# Patient Record
Sex: Female | Born: 1960 | Race: White | Hispanic: No | Marital: Single | Smoking: Never smoker
Health system: Southern US, Community
[De-identification: ages and names within clinical notes are randomized; demographics above are authoritative.]

## PROBLEM LIST (undated history)

## (undated) DIAGNOSIS — D241 Benign neoplasm of right breast: Secondary | ICD-10-CM

## (undated) DIAGNOSIS — I10 Essential (primary) hypertension: Secondary | ICD-10-CM

## (undated) DIAGNOSIS — M7730 Calcaneal spur, unspecified foot: Secondary | ICD-10-CM

## (undated) DIAGNOSIS — N6452 Nipple discharge: Secondary | ICD-10-CM

## (undated) DIAGNOSIS — Q676 Pectus excavatum: Secondary | ICD-10-CM

## (undated) DIAGNOSIS — Z9882 Breast implant status: Secondary | ICD-10-CM

## (undated) HISTORY — DX: Calcaneal spur, unspecified foot: M77.30

## (undated) HISTORY — DX: Pectus excavatum: Q67.6

## (undated) HISTORY — DX: Breast implant status: Z98.82

---

## 1987-09-11 HISTORY — PX: PLACEMENT OF BREAST IMPLANTS: SHX6334

## 1987-09-11 HISTORY — PX: AUGMENTATION MAMMAPLASTY: SUR837

## 1993-09-10 HISTORY — PX: TONSILLECTOMY: SUR1361

## 1998-10-20 ENCOUNTER — Other Ambulatory Visit: Admission: RE | Admit: 1998-10-20 | Discharge: 1998-10-20 | Payer: Self-pay | Admitting: *Deleted

## 1999-11-07 ENCOUNTER — Other Ambulatory Visit: Admission: RE | Admit: 1999-11-07 | Discharge: 1999-11-07 | Payer: Self-pay | Admitting: *Deleted

## 2000-04-15 ENCOUNTER — Other Ambulatory Visit: Admission: RE | Admit: 2000-04-15 | Discharge: 2000-04-15 | Payer: Self-pay | Admitting: *Deleted

## 2000-05-28 ENCOUNTER — Encounter (INDEPENDENT_AMBULATORY_CARE_PROVIDER_SITE_OTHER): Payer: Self-pay | Admitting: Specialist

## 2000-05-28 ENCOUNTER — Other Ambulatory Visit: Admission: RE | Admit: 2000-05-28 | Discharge: 2000-05-28 | Payer: Self-pay | Admitting: *Deleted

## 2000-06-18 ENCOUNTER — Other Ambulatory Visit: Admission: RE | Admit: 2000-06-18 | Discharge: 2000-06-18 | Payer: Self-pay | Admitting: *Deleted

## 2000-06-18 ENCOUNTER — Encounter (INDEPENDENT_AMBULATORY_CARE_PROVIDER_SITE_OTHER): Payer: Self-pay | Admitting: Specialist

## 2001-01-06 ENCOUNTER — Other Ambulatory Visit: Admission: RE | Admit: 2001-01-06 | Discharge: 2001-01-06 | Payer: Self-pay | Admitting: *Deleted

## 2001-06-24 ENCOUNTER — Other Ambulatory Visit: Admission: RE | Admit: 2001-06-24 | Discharge: 2001-06-24 | Payer: Self-pay | Admitting: *Deleted

## 2002-08-27 ENCOUNTER — Other Ambulatory Visit: Admission: RE | Admit: 2002-08-27 | Discharge: 2002-08-27 | Payer: Self-pay | Admitting: *Deleted

## 2003-04-07 ENCOUNTER — Encounter: Payer: Self-pay | Admitting: Emergency Medicine

## 2003-04-08 ENCOUNTER — Encounter: Payer: Self-pay | Admitting: General Surgery

## 2003-04-08 ENCOUNTER — Encounter: Payer: Self-pay | Admitting: Emergency Medicine

## 2003-04-08 ENCOUNTER — Inpatient Hospital Stay (HOSPITAL_COMMUNITY): Admission: EM | Admit: 2003-04-08 | Discharge: 2003-04-09 | Payer: Self-pay | Admitting: Emergency Medicine

## 2003-09-22 ENCOUNTER — Other Ambulatory Visit: Admission: RE | Admit: 2003-09-22 | Discharge: 2003-09-22 | Payer: Self-pay | Admitting: *Deleted

## 2004-02-24 ENCOUNTER — Encounter: Admission: RE | Admit: 2004-02-24 | Discharge: 2004-02-24 | Payer: Self-pay | Admitting: Family Medicine

## 2005-03-12 ENCOUNTER — Other Ambulatory Visit: Admission: RE | Admit: 2005-03-12 | Discharge: 2005-03-12 | Payer: Self-pay | Admitting: Obstetrics and Gynecology

## 2007-06-02 ENCOUNTER — Encounter: Admission: RE | Admit: 2007-06-02 | Discharge: 2007-06-02 | Payer: Self-pay | Admitting: Obstetrics and Gynecology

## 2007-09-11 HISTORY — PX: BREAST SURGERY: SHX581

## 2007-09-11 HISTORY — PX: OTHER SURGICAL HISTORY: SHX169

## 2007-09-17 ENCOUNTER — Ambulatory Visit (HOSPITAL_BASED_OUTPATIENT_CLINIC_OR_DEPARTMENT_OTHER): Admission: RE | Admit: 2007-09-17 | Discharge: 2007-09-17 | Payer: Self-pay | Admitting: General Surgery

## 2007-09-17 ENCOUNTER — Encounter (INDEPENDENT_AMBULATORY_CARE_PROVIDER_SITE_OTHER): Payer: Self-pay | Admitting: General Surgery

## 2011-01-23 NOTE — Op Note (Signed)
Kim Chang, Kim Chang                  ACCOUNT NO.:  [id number]   MEDICAL RECORD NO.:  [id number]          PATIENT TYPE:  AMB   LOCATION:  DSC                          FACILITY:  [facility]   PHYSICIAN:  Angelia Mould. Derrell Lolling, M.D.DATE OF BIRTH:  1961/05/28   DATE OF PROCEDURE:  09/17/2007  DATE OF DISCHARGE:                               OPERATIVE REPORT   PREOPERATIVE DIAGNOSIS:  Bloody nipple discharge right breast, rule out  malignancy.   POSTOPERATIVE DIAGNOSIS:  Bloody nipple discharge right breast, rule out  malignancy.   OPERATION PERFORMED:  Right breast biopsy with excision of subareolar  breast tissue.   SURGEON:  Angelia Mould. Derrell Lolling, M.D.   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  She states that she has had some spontaneous right nipple  discharge for one year.  She reported this to Dr. Henderson Cloud this fall.  She had imaging studies and there was no mass or architectural  distortion.  There was a visibly dilated duct at the 7 o'clock position  of the right nipple which was cannulated for a ductogram.  Only a small  amount of contrast went into the duct, ended in a blind ending fashion  20 mm below ductal orifice.  The radiologist was worried about  malignancy.  On exam, she has a circumareolar incision at the inferior  areolar margin.  There was no real palpable mass.  I really could not  reproduce the nipple discharge, but there was a duct that was a little  bit larger than the other ones.  I really did not know whether she has a  papilloma, a malignancy or simply a scarring phenomenon from her  previous surgery.  She is brought to the operating room for excision of  the subareolar breast tissue for clarification of her diagnosis.   OPERATIVE TECHNIQUE:  Following the induction of general endotracheal  anesthesia, the patient's right breast was prepped and draped in a  sterile fashion.  I used 0.5% Marcaine epinephrine as a local  infiltration anesthetic.  I made a circumareolar incision inferiorly  through the old scar.  I tried to pass a lacrimal duct probe through the  duct at the 7 o'clock position but could not get this to go more than  about 1 cm.  I continued dissecting down into the breast tissue and  removed an area of subareolar duct tissue about 2.5 cm in all  dimensions.  She really did not have much breast tissue between the skin  and the pectoral muscle and one area actually went down to the  pectoralis fascia.  I took a separate small piece of tissue in the  immediate area below the nipple to be sure.  There was nothing that felt  like cancer.  The wound was irrigated with saline.  Hemostasis was  excellent and achieved with electrocautery.  The deeper breast tissues  were closed with interrupted sutures of 3-0 Vicryl and skin closed with  running subcuticular suture of  4-0 Monocryl and Dermabond.  Clean  bandages were placed and the patient taken to the recovery room in  stable condition.   ESTIMATED BLOOD LOSS:  About 10 mL or less.   COMPLICATIONS:  None.   Sponge, needle and instrument counts were correct.      Angelia Mould. Derrell Lolling, M.D.  Electronically Signed     HMI/MEDQ  D:  09/17/2007  T:  09/17/2007  Job:  [id number]   cc:   Guy Sandifer. Henderson Cloud, M.D.

## 2011-01-26 NOTE — H&P (Signed)
NAME:  Kim Chang, SAUVE                            ACCOUNT NO.:  [id number]   MEDICAL RECORD NO.:  [id number]                   PATIENT TYPE:  INP   LOCATION:  1823                                 FACILITY:  [facility]   PHYSICIAN:  Angelia Mould. Derrell Lolling, M.D.             DATE OF BIRTH:  01/02/1961   DATE OF ADMISSION:  04/08/2003  DATE OF DISCHARGE:                                HISTORY & PHYSICAL   CHIEF COMPLAINT:  Facial pain following fall.   HISTORY OF PRESENT ILLNESS:  This is a 50 year old white female who admits  to drinking several beers this evening while out with her friends.  On the  way home she became a little bit diaphoretic and while climbing the front  steps of her house apparently fell, striking her face in some way.  She has  poor memory through the event and thinks that she had a syncopal episode.  She states this happened about one month ago.  She initially went to the  emergency room at [facility] where she had imaging studies which showed a  basilar skull fracture.  She has remained alert and hemodynamically stable  complaining only of pain centrally around her nose.  She was transferred to  [facility] for neurosurgical consultation and trauma evaluation.   PAST MEDICAL HISTORY:  1. She has had two pregnancies and two deliveries.  2. She has had a tonsillectomy.  3. She denies any medical or surgical disease otherwise.   CURRENT MEDICATIONS:  Birth control pills.   ALLERGIES:  None known.   SOCIAL HISTORY:  The patient is single.  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  She  does smoke cigarettes and does drink alcohol.  Her boyfriend and daughter  are here with her tonight.  She states she had a tetanus shot in the [facility] Emergency Room.   FAMILY HISTORY:  Mother living, has coronary artery disease.  Father living,  has cancer of the prostate, lung, and head and neck.   REVIEW OF SYSTEMS:  All systems reviewed.  They are noncontributory except  as described  above.   PHYSICAL EXAMINATION:  GENERAL:  Alert, middle-aged white female in mild  distress from nasal pain.  VITAL SIGNS:  Pulse 78, blood pressure 106/64, respirations 20, temperature  97.8, oxygen saturation 100% on room air.  SKIN:  Warm and dry.  HEENT:  Normocephalic, atraumatic.  There is a small laceration of the upper  lip that was repaired at the [facility] ER.  This is not bleeding.  There  is minimal swelling there.  The nose is a little bit tender, but minimal  swelling there.  There is some ecchymoses in the right infraorbital area and  she is tender over the anterior wall of the right maxillary sinus.  Eyes  show pupils to be 3 mm, equally round and reactive to light.  Extraocular  movements are intact.  No injury to the eyes noted.  Ears:  No injury to the  ear helix.  Tympanic membranes clear bilaterally.  Jaw and mouth:  There is,  as mentioned above, a laceration of the upper lip that has been repaired.  Occlusion is normal.  NECK:  Supple, nontender.  There is no pain with active and passive range of  motion.  There is no posterior tenderness.  There is no swelling or crepitus  or distended veins.  Trachea is midline.  CHEST:  Chest wall was nontender.  Lungs are clear.  No trauma there.  BREASTS:  Not examined.  HEART:  Regular rate and rhythm.  No murmur.  ABDOMEN:  Soft, nontender.  Active bowel sounds.  Not distended.  Liver and  spleen not enlarged.  No hernias noted.  BACK:  Thoracic and lumbar spine nontender.  No signs of any trauma.  GENITOURINARY:  No blood noted around the rectum or urethral meatus.  PELVIC:  Stable to compression, nontender.  EXTREMITIES:  She moves all four extremities well.  There is no pain or  deformity of the extremities.  NEUROLOGIC:  She is alert and oriented x4.  She has poor memory for the  event.  She has normal motor function and sensory function all four  extremities.  VASCULAR:  Carotid, radial, and posterior tibial  pulses are intact.   LABORATORIES:  At [facility] she had an alcohol level of 46.  She also had  a hemoglobin of 13.8 and that is apparently all the laboratories that were  done there.   Chest x-ray here is normal.   CT scan of the head shows a left basilar skull fracture which extends into  the left carotid canal.  CT scan of the brain is otherwise normal.  No  hematoma or swelling.   CT scan of the face shows minor nasal fracture and a nondisplaced fracture  of the anterior wall of the right maxillary sinus.   ASSESSMENT:  1. Fall.  2. Possible syncopal episode.  3. Left basilar skull fracture extending into the left carotid canal.  4. Nasal fracture.  5. Nondisplaced fracture of the inferior wall of the right maxillary sinus.  6. Minor lip laceration, repaired by [facility] staff.   PLAN:  1. The patient will be admitted to neurologic observation unit.  2. Dr. Aliene Beams will see the patient.  I have discussed the case with     him and he strongly advises that we proceed with cerebral angiography to     make sure that there is not an intimal flap or impending dissection of     the left carotid artery.  This has been discussed with the patient and     she is in agreement.                                                Angelia Mould. Derrell Lolling, M.D.    HMI/MEDQ  D:  04/08/2003  T:  04/08/2003  Job:  [id number]

## 2011-01-26 NOTE — Discharge Summary (Signed)
NAME:  Kim Chang, Kim Chang                            ACCOUNT NO.:  [id number]   MEDICAL RECORD NO.:  [id number]                   PATIENT TYPE:  INP   LOCATION:  3101                                 FACILITY:  [facility]   PHYSICIAN:  Jimmye Norman, M.D.                   DATE OF BIRTH:  12-Apr-1961   DATE OF ADMISSION:  04/08/2003  DATE OF DISCHARGE:  04/09/2003                                 DISCHARGE SUMMARY   CONSULTING PHYSICIAN:  Reinaldo Meeker, M.D., for neurosurgery.   FINAL DIAGNOSES:  1. Fall.  2. Basilar skull fracture with extension into left carotid canal.  3. Nasal fracture.  4. Fracture of anterior wall of right maxillary sinus.  5. Lip laceration.   HISTORY OF PRESENT ILLNESS:  This is a 50 year old white female who admitted  to drinking several beers in the evening while she was at a friend's.  On  the way home, she became a little bit diaphoresis and while climbing the  front steps of her house, she apparently fell, striking her face in some  way.  The patient has a poor memory of the events.  She was taken in to  [facility] where she was seen.  Imaging was done which showed a  basilar skull fracture.  She was subsequently brought to the [facility] Emergency Room.  There she was seen by Dr. Derrell Lolling and subsequently  by Dr. Gerlene Fee.   HOSPITAL COURSE:  The patient was subsequently admitted.  The findings were  noted.  She was ordered a cerebral angiogram to evaluate the carotid  arteries.  The patient subsequently underwent a four-vessel cerebral  angiogram which was negative for any injury to the arteries.  There was no  angiographic evidence of any dissection of the internal or external  carotids.  The venous outflow was normal.  This was subsequently negative  and Dr. Gerlene Fee noted that if this was negative it would be okay to  discharge the patient home.  On April 09, 2003, the patient was doing well,  tolerating diet satisfactorily, and was able to  get out of bed without  difficulty.  The catheterization site was okay.  She was ready for  discharge.  The patient was doing well at this time.   WOUND CARE:  She was told to use a little antibiotic ointment on the  abrasion sites on her chin area.   FOLLOWUP:  She was told that she should follow up with [place] in approximately  one week.  She was given an appointment to see [place].    DISCHARGE MEDICATIONS:  She was given Lortab 5/500 mg one or two p.o. q.4-  6h. p.r.n. for pain, 30 of these with no refills.   DISPOSITION:  She was subsequently discharged home in satisfactory and  stable condition on April 09, 2003.  Phineas Semen, P.A.                      Jimmye Norman, M.D.    CL/MEDQ  D:  04/09/2003  T:  04/09/2003  Job:  [id number]   cc:   Jimmye Norman III, M.D.  [address]  [place]  [place] [address]  Fax: [phone]   Reinaldo Meeker, M.D.  [address]  [place]  [place] [address]  Fax: [phone]

## 2011-05-31 LAB — COMPREHENSIVE METABOLIC PANEL
AST: 29
BUN: 11
CO2: 27
Chloride: 106
Creatinine, Ser: 0.86
GFR calc non Af Amer: >60
Glucose, Bld: 172 — ABNORMAL HIGH
Total Bilirubin: 0.6

## 2011-05-31 LAB — DIFFERENTIAL
Basophils Absolute: 0
Eosinophils Relative: 2
Lymphocytes Relative: 28
Neutro Abs: 4.9
Neutrophils Relative %: 63

## 2011-05-31 LAB — CBC
HCT: 37
Hemoglobin: 12.8
MCHC: 34.7
MCV: 92.9
RBC: 3.98
WBC: 7.7

## 2011-05-31 LAB — URINALYSIS, ROUTINE W REFLEX MICROSCOPIC
Ketones, ur: NEGATIVE
Nitrite: NEGATIVE
pH: 7

## 2012-11-10 ENCOUNTER — Encounter: Payer: Self-pay | Admitting: Family Medicine

## 2012-11-10 DIAGNOSIS — Q676 Pectus excavatum: Secondary | ICD-10-CM | POA: Insufficient documentation

## 2013-06-22 ENCOUNTER — Encounter: Payer: Self-pay | Admitting: Internal Medicine

## 2013-07-28 ENCOUNTER — Encounter: Payer: Self-pay | Admitting: Internal Medicine

## 2013-09-22 ENCOUNTER — Encounter: Payer: Self-pay | Admitting: Internal Medicine

## 2013-09-24 ENCOUNTER — Encounter: Payer: Self-pay | Admitting: Obstetrics and Gynecology

## 2014-05-19 ENCOUNTER — Encounter: Payer: Self-pay | Admitting: Family

## 2014-05-19 ENCOUNTER — Ambulatory Visit (INDEPENDENT_AMBULATORY_CARE_PROVIDER_SITE_OTHER): Payer: 59 | Admitting: Family

## 2014-05-19 ENCOUNTER — Encounter (INDEPENDENT_AMBULATORY_CARE_PROVIDER_SITE_OTHER): Payer: Self-pay

## 2014-05-19 VITALS — BP 135/93 | HR 74 | Temp 99.1°F | Ht 65.0 in | Wt 141.8 lb

## 2014-05-19 DIAGNOSIS — I10 Essential (primary) hypertension: Secondary | ICD-10-CM

## 2014-05-19 DIAGNOSIS — Z0000 Encounter for general adult medical examination without abnormal findings: Secondary | ICD-10-CM

## 2014-05-19 DIAGNOSIS — E049 Nontoxic goiter, unspecified: Secondary | ICD-10-CM

## 2014-05-19 LAB — POCT URINALYSIS DIPSTICK
BILIRUBIN UA: NEGATIVE
Glucose, UA: NEGATIVE
Ketones, UA: NEGATIVE
LEUKOCYTES UA: NEGATIVE
NITRITE UA: NEGATIVE
PH UA: 7.5
PROTEIN UA: NEGATIVE
RBC UA: NEGATIVE
Spec Grav, UA: 1.01
Urobilinogen, UA: NEGATIVE

## 2014-05-19 LAB — POCT UA - MICROSCOPIC ONLY
CASTS, UR, LPF, POC: NEGATIVE
CRYSTALS, UR, HPF, POC: NEGATIVE
MUCUS UA: NEGATIVE
YEAST UA: NEGATIVE

## 2014-05-19 NOTE — Progress Notes (Signed)
   Subjective:    Patient ID: Kim Chang, female    DOB: 06-Apr-1961, 53 y.o.   MRN: [id number]  HPI Pt presents to office for annual exam with no pap. Pt currently not taking any medications. Pt states she has taken her blood pressure at home and it is "always high". Pt denies pain, palpation, headache, SOB, or edema.   Review of Systems  Constitutional: Negative.   HENT: Negative.   Eyes: Negative.   Respiratory: Negative.  Negative for shortness of breath.   Cardiovascular: Negative.  Negative for palpitations.  Gastrointestinal: Negative.   Endocrine: Negative.   Genitourinary: Negative.   Musculoskeletal: Negative.   Neurological: Negative.  Negative for headaches.  Hematological: Negative.   Psychiatric/Behavioral: Negative.   All other systems reviewed and are negative.      Objective:   Physical Exam  Vitals reviewed. Constitutional: She is oriented to person, place, and time. She appears well-developed and well-nourished. No distress.  HENT:  Head: Normocephalic and atraumatic.  Right Ear: External ear normal.  Left Ear: External ear normal.  Mouth/Throat: Oropharynx is clear and moist.  Eyes: Pupils are equal, round, and reactive to light.  Neck: Normal range of motion. Neck supple. No thyromegaly present.  Cardiovascular: Normal rate, regular rhythm, normal heart sounds and intact distal pulses.   No murmur heard. Pulmonary/Chest: Effort normal and breath sounds normal. No respiratory distress. She has no wheezes.  Abdominal: Soft. Bowel sounds are normal. She exhibits no distension. There is no tenderness.  Musculoskeletal: Normal range of motion. She exhibits no edema and no tenderness.  Neurological: She is alert and oriented to person, place, and time. She has normal reflexes. No cranial nerve deficit.  Skin: Skin is warm and dry.  Psychiatric: She has a normal mood and affect. Her behavior is normal. Judgment and thought content normal.    BP 135/93   Pulse 74  Temp(Src) 99.1 F (37.3 C) (Oral)  Ht 5\' 5"  (1.651 m)  Wt 141 lb 12.8 oz (64.32 kg)  BMI 23.60 kg/m2  LMP 04/19/2014       Assessment & Plan:  1. Annual physical exam  2. Essential hypertension, benign -Pt states she wants to try a low salt and exercise before adding medication  -Will see pt in 6 months and if blood pressure still elevated will start medication at time  3. Goiter   Continue all meds Labs pending Health Maintenance reviewed- hemoccult cards given to patient with directions Diet and exercise encouraged RTO 6 months-Blood pressure check  Evelina Dun, FNP

## 2014-05-19 NOTE — Addendum Note (Signed)
Addended by: Jamelle Haring on: 05/19/2014 11:12 AM   Modules accepted: Orders

## 2014-05-19 NOTE — Patient Instructions (Addendum)
Health Maintenance Adopting a healthy lifestyle and getting preventive care can go a long way to promote health and wellness. Talk with your health care provider about what schedule of regular examinations is right for you. This is a good chance for you to check in with your provider about disease prevention and staying healthy. In between checkups, there are plenty of things you can do on your own. Experts have done a lot of research about which lifestyle changes and preventive measures are most likely to keep you healthy. Ask your health care provider for more information. WEIGHT AND DIET  Eat a healthy diet  Be sure to include plenty of vegetables, fruits, low-fat dairy products, and lean protein.  Do not eat a lot of foods high in solid fats, added sugars, or salt.  Get regular exercise. This is one of the most important things you can do for your health.  Most adults should exercise for at least 150 minutes each week. The exercise should increase your heart rate and make you sweat (moderate-intensity exercise).  Most adults should also do strengthening exercises at least twice a week. This is in addition to the moderate-intensity exercise.  Maintain a healthy weight  Body mass index (BMI) is a measurement that can be used to identify possible weight problems. It estimates body fat based on height and weight. Your health care provider can help determine your BMI and help you achieve or maintain a healthy weight.  For females 69 years of age and older:   A BMI below 18.5 is considered underweight.  A BMI of 18.5 to 24.9 is normal.  A BMI of 25 to 29.9 is considered overweight.  A BMI of 30 and above is considered obese.  Watch levels of cholesterol and blood lipids  You should start having your blood tested for lipids and cholesterol at 52 years of age, then have this test every 5 years.  You may need to have your cholesterol levels checked more often if:  Your lipid or  cholesterol levels are high.  You are older than 52 years of age.  You are at high risk for heart disease.  CANCER SCREENING   Lung Cancer  Lung cancer screening is recommended for adults 71-62 years old who are at high risk for lung cancer because of a history of smoking.  A yearly low-dose CT scan of the lungs is recommended for people who:  Currently smoke.  Have quit within the past 15 years.  Have at least a 30-pack-year history of smoking. A pack year is smoking an average of one pack of cigarettes a day for 1 year.  Yearly screening should continue until it has been 15 years since you quit.  Yearly screening should stop if you develop a health problem that would prevent you from having lung cancer treatment.  Breast Cancer  Practice breast self-awareness. This means understanding how your breasts normally appear and feel.  It also means doing regular breast self-exams. Let your health care provider know about any changes, no matter how small.  If you are in your 20s or 30s, you should have a clinical breast exam (CBE) by a health care provider every 1-3 years as part of a regular health exam.  If you are 79 or older, have a CBE every year. Also consider having a breast X-ray (mammogram) every year.  If you have a family history of breast cancer, talk to your health care provider about genetic screening.  If you are  at high risk for breast cancer, talk to your health care provider about having an MRI and a mammogram every year.  Breast cancer gene (BRCA) assessment is recommended for women who have family members with BRCA-related cancers. BRCA-related cancers include:  Breast.  Ovarian.  Tubal.  Peritoneal cancers.  Results of the assessment will determine the need for genetic counseling and BRCA1 and BRCA2 testing. Cervical Cancer Routine pelvic examinations to screen for cervical cancer are no longer recommended for nonpregnant women who are considered low  risk for cancer of the pelvic organs (ovaries, uterus, and vagina) and who do not have symptoms. A pelvic examination may be necessary if you have symptoms including those associated with pelvic infections. Ask your health care provider if a screening pelvic exam is right for you.   The Pap test is the screening test for cervical cancer for women who are considered at risk.  If you had a hysterectomy for a problem that was not cancer or a condition that could lead to cancer, then you no longer need Pap tests.  If you are older than 65 years, and you have had normal Pap tests for the past 10 years, you no longer need to have Pap tests.  If you have had past treatment for cervical cancer or a condition that could lead to cancer, you need Pap tests and screening for cancer for at least 20 years after your treatment.  If you no longer get a Pap test, assess your risk factors if they change (such as having a new sexual partner). This can affect whether you should start being screened again.  Some women have medical problems that increase their chance of getting cervical cancer. If this is the case for you, your health care provider may recommend more frequent screening and Pap tests.  The human papillomavirus (HPV) test is another test that may be used for cervical cancer screening. The HPV test looks for the virus that can cause cell changes in the cervix. The cells collected during the Pap test can be tested for HPV.  The HPV test can be used to screen women 30 years of age and older. Getting tested for HPV can extend the interval between normal Pap tests from three to five years.  An HPV test also should be used to screen women of any age who have unclear Pap test results.  After 52 years of age, women should have HPV testing as often as Pap tests.  Colorectal Cancer  This type of cancer can be detected and often prevented.  Routine colorectal cancer screening usually begins at 52 years of  age and continues through 52 years of age.  Your health care provider may recommend screening at an earlier age if you have risk factors for colon cancer.  Your health care provider may also recommend using home test kits to check for hidden blood in the stool.  A small camera at the end of a tube can be used to examine your colon directly (sigmoidoscopy or colonoscopy). This is done to check for the earliest forms of colorectal cancer.  Routine screening usually begins at age 50.  Direct examination of the colon should be repeated every 5-10 years through 52 years of age. However, you may need to be screened more often if early forms of precancerous polyps or small growths are found. Skin Cancer  Check your skin from head to toe regularly.  Tell your health care provider about any new moles or changes in   moles, especially if there is a change in a mole's shape or color.  Also tell your health care provider if you have a mole that is larger than the size of a pencil eraser.  Always use sunscreen. Apply sunscreen liberally and repeatedly throughout the day.  Protect yourself by wearing long sleeves, pants, a wide-brimmed hat, and sunglasses whenever you are outside. HEART DISEASE, DIABETES, AND HIGH BLOOD PRESSURE   Have your blood pressure checked at least every 1-2 years. High blood pressure causes heart disease and increases the risk of stroke.  If you are between 16 years and 60 years old, ask your health care provider if you should take aspirin to prevent strokes.  Have regular diabetes screenings. This involves taking a blood sample to check your fasting blood sugar level.  If you are at a normal weight and have a low risk for diabetes, have this test once every three years after 52 years of age.  If you are overweight and have a high risk for diabetes, consider being tested at a younger age or more often. PREVENTING INFECTION  Hepatitis B  If you have a higher risk for  hepatitis B, you should be screened for this virus. You are considered at high risk for hepatitis B if:  You were born in a country where hepatitis B is common. Ask your health care provider which countries are considered high risk.  Your parents were born in a high-risk country, and you have not been immunized against hepatitis B (hepatitis B vaccine).  You have HIV or AIDS.  You use needles to inject street drugs.  You live with someone who has hepatitis B.  You have had sex with someone who has hepatitis B.  You get hemodialysis treatment.  You take certain medicines for conditions, including cancer, organ transplantation, and autoimmune conditions. Hepatitis C  Blood testing is recommended for:  Everyone born from [birth year] through 1964.  Anyone with known risk factors for hepatitis C. Sexually transmitted infections (STIs)  You should be screened for sexually transmitted infections (STIs) including gonorrhea and chlamydia if:  You are sexually active and are younger than 52 years of age.  You are older than 52 years of age and your health care provider tells you that you are at risk for this type of infection.  Your sexual activity has changed since you were last screened and you are at an increased risk for chlamydia or gonorrhea. Ask your health care provider if you are at risk.  If you do not have HIV, but are at risk, it may be recommended that you take a prescription medicine daily to prevent HIV infection. This is called pre-exposure prophylaxis (PrEP). You are considered at risk if:  You are sexually active and do not regularly use condoms or know the HIV status of your partner(s).  You take drugs by injection.  You are sexually active with a partner who has HIV. Talk with your health care provider about whether you are at high risk of being infected with HIV. If you choose to begin PrEP, you should first be tested for HIV. You should then be tested every 3 months for  as long as you are taking PrEP.  PREGNANCY   If you are premenopausal and you may become pregnant, ask your health care provider about preconception counseling.  If you may become pregnant, take 400 to 800 micrograms (mcg) of folic acid every day.  If you want to prevent pregnancy, talk to your  health care provider about birth control (contraception). OSTEOPOROSIS AND MENOPAUSE   Osteoporosis is a disease in which the bones lose minerals and strength with aging. This can result in serious bone fractures. Your risk for osteoporosis can be identified using a bone density scan.  If you are 93 years of age or older, or if you are at risk for osteoporosis and fractures, ask your health care provider if you should be screened.  Ask your health care provider whether you should take a calcium or vitamin D supplement to lower your risk for osteoporosis.  Menopause may have certain physical symptoms and risks.  Hormone replacement therapy may reduce some of these symptoms and risks. Talk to your health care provider about whether hormone replacement therapy is right for you.  HOME CARE INSTRUCTIONS   Schedule regular health, dental, and eye exams.  Stay current with your immunizations.   Do not use any tobacco products including cigarettes, chewing tobacco, or electronic cigarettes.  If you are pregnant, do not drink alcohol.  If you are breastfeeding, limit how much and how often you drink alcohol.  Limit alcohol intake to no more than 1 drink per day for nonpregnant women. One drink equals 12 ounces of beer, 5 ounces of wine, or 1 ounces of hard liquor.  Do not use street drugs.  Do not share needles.  Ask your health care provider for help if you need support or information about quitting drugs.  Tell your health care provider if you often feel depressed.  Tell your health care provider if you have ever been abused or do not feel safe at home. Document Released: 03/12/2011  Document Revised: 01/11/2014 Document Reviewed: 07/29/2013 [facility] Patient Information 2015 [place], [place]. This information is not intended to replace advice given to you by your health care provider. Make sure you discuss any questions you have with your health care provider. Hypertension Hypertension, commonly called high blood pressure, is when the force of blood pumping through your arteries is too strong. Your arteries are the blood vessels that carry blood from your heart throughout your body. A blood pressure reading consists of a higher number over a lower number, such as 110/72. The higher number (systolic) is the pressure inside your arteries when your heart pumps. The lower number (diastolic) is the pressure inside your arteries when your heart relaxes. Ideally you want your blood pressure below 120/80. Hypertension forces your heart to work harder to pump blood. Your arteries may become narrow or stiff. Having hypertension puts you at risk for heart disease, stroke, and other problems.  RISK FACTORS Some risk factors for high blood pressure are controllable. Others are not.  Risk factors you cannot control include:  Race. You may be at higher risk if you are African American. Age. Risk increases with age. Gender. Men are at higher risk than women before age 31 years. After age 61, women are at higher risk than men. Risk factors you can control include: Not getting enough exercise or physical activity. Being overweight. Getting too much fat, sugar, calories, or salt in your diet. Drinking too much alcohol. SIGNS AND SYMPTOMS Hypertension does not usually cause signs or symptoms. Extremely high blood pressure (hypertensive crisis) may cause headache, anxiety, shortness of breath, and nosebleed. DIAGNOSIS  To check if you have hypertension, your health care provider will measure your blood pressure while you are seated, with your arm held at the level of your heart. It should be  measured at least twice using  the same arm. Certain conditions can cause a difference in blood pressure between your right and left arms. A blood pressure reading that is higher than normal on one occasion does not mean that you need treatment. If one blood pressure reading is high, ask your health care provider about having it checked again. TREATMENT  Treating high blood pressure includes making lifestyle changes and possibly taking medicine. Living a healthy lifestyle can help lower high blood pressure. You may need to change some of your habits. Lifestyle changes may include: Following the DASH diet. This diet is high in fruits, vegetables, and whole grains. It is low in salt, red meat, and added sugars. Getting at least 2 hours of brisk physical activity every week. Losing weight if necessary. Not smoking. Limiting alcoholic beverages. Learning ways to reduce stress. If lifestyle changes are not enough to get your blood pressure under control, your health care provider may prescribe medicine. You may need to take more than one. Work closely with your health care provider to understand the risks and benefits. HOME CARE INSTRUCTIONS Have your blood pressure rechecked as directed by your health care provider.  Take medicines only as directed by your health care provider. Follow the directions carefully. Blood pressure medicines must be taken as prescribed. The medicine does not work as well when you skip doses. Skipping doses also puts you at risk for problems.  Do not smoke.  Monitor your blood pressure at home as directed by your health care provider. SEEK MEDICAL CARE IF:  You think you are having a reaction to medicines taken. You have recurrent headaches or feel dizzy. You have swelling in your ankles. You have trouble with your vision. SEEK IMMEDIATE MEDICAL CARE IF: You develop a severe headache or confusion. You have unusual weakness, numbness, or feel faint. You have severe  chest or abdominal pain. You vomit repeatedly. You have trouble breathing. MAKE SURE YOU:  Understand these instructions. Will watch your condition. Will get help right away if you are not doing well or get worse. Document Released: 08/27/2005 Document Revised: 01/11/2014 Document Reviewed: 06/19/2013 [facility] Patient Information 2015 [place], [place]. This information is not intended to replace advice given to you by your health care provider. Make sure you discuss any questions you have with your health care provider. DASH Eating Plan DASH stands for "Dietary Approaches to Stop Hypertension." The DASH eating plan is a healthy eating plan that has been shown to reduce high blood pressure (hypertension). Additional health benefits may include reducing the risk of type 2 diabetes mellitus, heart disease, and stroke. The DASH eating plan may also help with weight loss. WHAT DO I NEED TO KNOW ABOUT THE DASH EATING PLAN? For the DASH eating plan, you will follow these general guidelines: Choose foods with a percent daily value for sodium of less than 5% (as listed on the food label). Use salt-free seasonings or herbs instead of table salt or sea salt. Check with your health care provider or pharmacist before using salt substitutes. Eat lower-sodium products, often labeled as "lower sodium" or "no salt added." Eat fresh foods. Eat more vegetables, fruits, and low-fat dairy products. Choose whole grains. Look for the word "whole" as the first word in the ingredient list. Choose fish and skinless chicken or [place] more often than red meat. Limit fish, poultry, and meat to 6 oz (170 g) each day. Limit sweets, desserts, sugars, and sugary drinks. Choose heart-healthy fats. Limit cheese to 1 oz (28 g) per day. Eat  more home-cooked food and less restaurant, buffet, and fast food. Limit fried foods. Cook foods using methods other than frying. Limit canned vegetables. If you do use them, rinse them well  to decrease the sodium. When eating at a restaurant, ask that your food be prepared with less salt, or no salt if possible. WHAT FOODS CAN I EAT? Seek help from a dietitian for individual calorie needs. Grains Whole grain or whole wheat bread. Brown rice. Whole grain or whole wheat pasta. Quinoa, bulgur, and whole grain cereals. Low-sodium cereals. Corn or whole wheat flour tortillas. Whole grain cornbread. Whole grain crackers. Low-sodium crackers. Vegetables Fresh or frozen vegetables (raw, steamed, roasted, or grilled). Low-sodium or reduced-sodium tomato and vegetable juices. Low-sodium or reduced-sodium tomato sauce and paste. Low-sodium or reduced-sodium canned vegetables.  Fruits All fresh, canned (in natural juice), or frozen fruits. Meat and Other Protein Products Ground beef (85% or leaner), grass-fed beef, or beef trimmed of fat. Skinless chicken or [place]. Ground chicken or [place]. Pork trimmed of fat. All fish and seafood. Eggs. Dried beans, peas, or lentils. Unsalted nuts and seeds. Unsalted canned beans. Dairy Low-fat dairy products, such as skim or 1% milk, 2% or reduced-fat cheeses, low-fat ricotta or cottage cheese, or plain low-fat yogurt. Low-sodium or reduced-sodium cheeses. Fats and Oils Tub margarines without trans fats. Light or reduced-fat mayonnaise and salad dressings (reduced sodium). Avocado. Safflower, olive, or canola oils. Natural peanut or almond butter. Other Unsalted popcorn and pretzels. The items listed above may not be a complete list of recommended foods or beverages. Contact your dietitian for more options. WHAT FOODS ARE NOT RECOMMENDED? Grains White bread. White pasta. White rice. Refined cornbread. Bagels and croissants. Crackers that contain trans fat. Vegetables Creamed or fried vegetables. Vegetables in a cheese sauce. Regular canned vegetables. Regular canned tomato sauce and paste. Regular tomato and vegetable juices. Fruits Dried fruits.  Canned fruit in light or heavy syrup. Fruit juice. Meat and Other Protein Products Fatty cuts of meat. Ribs, chicken wings, bacon, sausage, bologna, salami, chitterlings, fatback, hot dogs, bratwurst, and packaged luncheon meats. Salted nuts and seeds. Canned beans with salt. Dairy Whole or 2% milk, cream, half-and-half, and cream cheese. Whole-fat or sweetened yogurt. Full-fat cheeses or blue cheese. Nondairy creamers and whipped toppings. Processed cheese, cheese spreads, or cheese curds. Condiments Onion and garlic salt, seasoned salt, table salt, and sea salt. Canned and packaged gravies. Worcestershire sauce. Tartar sauce. Barbecue sauce. Teriyaki sauce. Soy sauce, including reduced sodium. Steak sauce. Fish sauce. Oyster sauce. Cocktail sauce. Horseradish. Ketchup and mustard. Meat flavorings and tenderizers. Bouillon cubes. Hot sauce. Tabasco sauce. Marinades. Taco seasonings. Relishes. Fats and Oils Butter, stick margarine, lard, shortening, ghee, and bacon fat. Coconut, palm kernel, or palm oils. Regular salad dressings. Other Pickles and olives. Salted popcorn and pretzels. The items listed above may not be a complete list of foods and beverages to avoid. Contact your dietitian for more information. WHERE CAN I FIND MORE INFORMATION? National [employer], [employer], [employer]: travelstabloid.com Document Released: 08/16/2011 Document Revised: 01/11/2014 Document Reviewed: 07/01/2013 [facility] Patient Information 2015 [place], [place]. This information is not intended to replace advice given to you by your health care provider. Make sure you discuss any questions you have with your health care provider.

## 2014-05-20 LAB — CMP14+EGFR
A/G RATIO: 2.3 (ref 1.1–2.5)
ALBUMIN: 5 g/dL (ref 3.5–5.5)
ALK PHOS: 62 IU/L (ref 39–117)
ALT: 10 IU/L (ref 0–32)
AST: 16 IU/L (ref 0–40)
BILIRUBIN TOTAL: 0.8 mg/dL (ref 0.0–1.2)
BUN / CREAT RATIO: 16 (ref 9–23)
BUN: 14 mg/dL (ref 6–24)
CHLORIDE: 100 mmol/L (ref 97–108)
CO2: 25 mmol/L (ref 18–29)
Calcium: 9.5 mg/dL (ref 8.7–10.2)
Creatinine, Ser: 0.86 mg/dL (ref 0.57–1.00)
GFR, EST AFRICAN AMERICAN: 90 mL/min/{1.73_m2} (ref >59)
GFR, EST NON AFRICAN AMERICAN: 78 mL/min/{1.73_m2} (ref >59)
GLUCOSE: 99 mg/dL (ref 65–99)
Globulin, Total: 2.2 g/dL (ref 1.5–4.5)
Potassium: 5.4 mmol/L — ABNORMAL HIGH (ref 3.5–5.2)
Sodium: 140 mmol/L (ref 134–144)
TOTAL PROTEIN: 7.2 g/dL (ref 6.0–8.5)

## 2014-05-20 LAB — THYROID PANEL WITH TSH
Free Thyroxine Index: 2.7 (ref 1.2–4.9)
T3 UPTAKE RATIO: 34 % (ref 24–39)
T4 TOTAL: 7.9 ug/dL (ref 4.5–12.0)
TSH: 1.29 u[IU]/mL (ref 0.450–4.500)

## 2014-05-20 LAB — VITAMIN D 25 HYDROXY (VIT D DEFICIENCY, FRACTURES): Vit D, 25-Hydroxy: 30.4 ng/mL (ref 30.0–100.0)

## 2014-05-20 LAB — LIPID PANEL
CHOL/HDL RATIO: 2.5 ratio (ref 0.0–4.4)
CHOLESTEROL TOTAL: 225 mg/dL — AB (ref 100–199)
HDL: 89 mg/dL (ref >39)
LDL CALC: 110 mg/dL — AB (ref 0–99)
Triglycerides: 132 mg/dL (ref 0–149)
VLDL CHOLESTEROL CAL: 26 mg/dL (ref 5–40)

## 2014-05-21 ENCOUNTER — Other Ambulatory Visit: Payer: Self-pay | Admitting: Family

## 2014-05-21 MED ORDER — VITAMIN D (ERGOCALCIFEROL) 1.25 MG (50000 UNIT) PO CAPS: 50000.0000 [IU] | ORAL_CAPSULE | ORAL | Status: DC

## 2014-05-21 MED ORDER — SIMVASTATIN 40 MG PO TABS
40.0000 mg | ORAL_TABLET | Freq: Every day | ORAL | Status: DC
Start: 2014-05-21 — End: 2017-04-02

## 2014-05-24 ENCOUNTER — Encounter: Payer: Self-pay | Admitting: Family

## 2014-05-24 ENCOUNTER — Telehealth: Payer: Self-pay | Admitting: Family

## 2014-05-24 NOTE — Telephone Encounter (Signed)
Message copied by Cline Crock on Mon May 24, 2014  4:13 PM ------      Message from: [place], [place] A      Created: Fri May 21, 2014 10:07 AM       Urine negative for infection      Kidney and liver function stable      Potassium slightly elevated- Avoid any supplemental K+      Cholesterol elevated- RX sent to pharmacy      Vit D levels low- RX sent to pharmacy      Thyroid levels WNL ------

## 2014-06-29 ENCOUNTER — Other Ambulatory Visit: Payer: Self-pay | Admitting: Obstetrics and Gynecology

## 2014-06-30 LAB — CYTOLOGY - PAP

## 2014-11-09 ENCOUNTER — Ambulatory Visit: Payer: 59 | Admitting: Family

## 2016-07-24 ENCOUNTER — Other Ambulatory Visit: Payer: Self-pay | Admitting: Obstetrics and Gynecology

## 2016-07-24 DIAGNOSIS — N6452 Nipple discharge: Secondary | ICD-10-CM

## 2016-07-27 ENCOUNTER — Ambulatory Visit
Admission: RE | Admit: 2016-07-27 | Discharge: 2016-07-27 | Disposition: A | Discharge status: Home / Self Care | Payer: 59 | Source: Ambulatory Visit | Attending: Obstetrics and Gynecology | Admitting: Obstetrics and Gynecology

## 2016-07-27 ENCOUNTER — Other Ambulatory Visit: Payer: Self-pay

## 2016-07-27 DIAGNOSIS — N6452 Nipple discharge: Secondary | ICD-10-CM

## 2016-07-30 ENCOUNTER — Other Ambulatory Visit: Payer: Self-pay | Admitting: Obstetrics and Gynecology

## 2016-07-30 DIAGNOSIS — N6452 Nipple discharge: Secondary | ICD-10-CM

## 2016-08-21 ENCOUNTER — Encounter: Payer: Self-pay | Admitting: Radiology

## 2016-08-21 ENCOUNTER — Ambulatory Visit
Admission: RE | Admit: 2016-08-21 | Discharge: 2016-08-21 | Disposition: A | Discharge status: Home / Self Care | Payer: 59 | Source: Ambulatory Visit | Attending: Obstetrics and Gynecology | Admitting: Obstetrics and Gynecology

## 2016-08-21 DIAGNOSIS — N6452 Nipple discharge: Secondary | ICD-10-CM

## 2016-08-21 MED ORDER — GADOBENATE DIMEGLUMINE 529 MG/ML IV SOLN
13.0000 mL | Freq: Once | INTRAVENOUS | Status: AC | PRN
  Administered 2016-08-21: 13 mL via INTRAVENOUS

## 2016-09-06 ENCOUNTER — Ambulatory Visit
Admission: RE | Admit: 2016-09-06 | Discharge: 2016-09-06 | Disposition: A | Discharge status: Home / Self Care | Payer: 59 | Source: Ambulatory Visit | Attending: Cardiology | Admitting: Cardiology

## 2016-09-06 ENCOUNTER — Other Ambulatory Visit: Payer: Self-pay | Admitting: Cardiology

## 2016-09-06 DIAGNOSIS — R0602 Shortness of breath: Secondary | ICD-10-CM

## 2016-12-21 ENCOUNTER — Other Ambulatory Visit: Payer: Self-pay | Admitting: General Surgery

## 2016-12-21 DIAGNOSIS — N6452 Nipple discharge: Secondary | ICD-10-CM

## 2016-12-21 DIAGNOSIS — D241 Benign neoplasm of right breast: Secondary | ICD-10-CM

## 2017-01-14 ENCOUNTER — Other Ambulatory Visit: Payer: Self-pay | Admitting: General Surgery

## 2017-04-02 ENCOUNTER — Encounter (HOSPITAL_BASED_OUTPATIENT_CLINIC_OR_DEPARTMENT_OTHER): Payer: Self-pay | Admitting: *Deleted

## 2017-04-04 NOTE — H&P (Signed)
[place] Location: [facility] Patient #: [id number] DOB: [birth year] Single / Language: Kim Chang / Race: White Female        History of Present Illness  The patient is a 56 year old female who presents with a complaint of spontaneous right nipple discharge. This is a 56 year old Caucasian female who returns to discuss right nipple discharge. Her daughter is with her throughout the encounter. Dr. Gaetano Net is her gynecologist. Dr. Jacelyn Grip is her PCP. Dr. Einar Gip is her cardiologist, and saw her for her hypertension once.  Many years ago she underwent unilateral right subpectoral implant because of pectus excavatum and got a good result with symmetry by Dr. Wendy Poet. 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 I saw her 6 months ago at which time she was complaining of nipple discharge on the right for 3 months. She doesn't see the discharge was sometimes sees clear or green spots in her bra. She had imaging studies 6 months ago including mammogram and MRI. No abnormalities on routine imaging. MRI saw a 5 mm oval mass at the 6 o'clock position of the nipple. Not amenable to image guided biopsy. She was referred for excision. She declined excision at that time and I offered to see her back in 6 months.  In the interim she continued to have spontaneous clear discharge that she sees the spots in her bra. She had one episode recently of bloody discharge and came to talk about surgical management.  Past history reveals the 2 breast surgeries as discussed above. Pectus excavatum. Recently started on valsartan for hypertension. Family history is negative for breast or colon cancer. Father died of lung cancer. Mother living with coronary artery disease.  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 2 children. Single. Lives in [place]. Does not smoke  cigarettes but does smoke marijuana occasionally. Alcohol occasionally.  We had a long talk along with her daughter. I told her the standard of care here was excision of the involved nipple duct. I explained general anesthesia, use of lacrimal duct probes, inferiorly placed incision to avoid further damage to blood supply. I told her that my biggest concern was nipple sensory deprivation and flattening. Also risk of bleeding infection and injury or rupture of her implant. I went over the indications, details, techniques, and numerous risk of the surgery. She understands all of these issues. All of her questions were answered. She agrees with this plan wants to go ahead.   Allergies  No Known Drug Allergies 08/27/2016 Allergies Reconciled   Medication History  Valsartan (160MG  Tablet, Oral) Active. No Current Medications (Taken starting 01/14/2017) Medications Reconciled  Vitals  Weight: 146.8 lb Height: 64in Body Surface Area: 1.72 m Body Mass Index: 25.2 kg/m  Temp.: 98.59F  Pulse: 89 (Regular)  BP: 112/70 (Sitting, Left Arm, Standard)    Physical Exam  General Mental Status-Alert. General Appearance-Not in acute distress. Build & Nutrition-Well nourished. Posture-Normal posture. Gait-Normal.  Head and Neck Head-normocephalic, atraumatic with no lesions or palpable masses. Trachea-midline. Thyroid Gland Characteristics - normal size and consistency and no palpable nodules.  Chest and Lung Exam Chest and lung exam reveals -on auscultation, normal breath sounds, no adventitious sounds and normal vocal resonance. Note: Pectus excavatum   Breast Note: Palpable implant right breast. Pectus excavatum. Very good symmetry. Right nipple tilted downward a little bit but not flat. Well-healed inferiorly based circumareolar incision.  I can easily and repeatedly elicit a clear discharge from the right nipple at a 6:00 duct. No masses in  either breast. No axillary adenopathy.   Cardiovascular Cardiovascular examination reveals -normal heart sounds, regular rate and rhythm with no murmurs and femoral artery auscultation bilaterally reveals normal pulses, no bruits, no thrills.  Abdomen Inspection Inspection of the abdomen reveals - No Hernias. Palpation/Percussion Palpation and Percussion of the abdomen reveal - Soft, Non Tender, No Rigidity (guarding), No hepatosplenomegaly and No Palpable abdominal masses.  Neurologic Neurologic evaluation reveals -alert and oriented x 3 with no impairment of recent or remote memory, normal attention span and ability to concentrate, normal sensation and normal coordination.  Musculoskeletal Normal Exam - Bilateral-Upper Extremity Strength Normal and Lower Extremity Strength Normal.    Assessment & Plan  PAPILLOMA OF BREAST (D24.9) Future Plans 02/04/2017: ULTRASOUND, RIGHT BREAST [phone]) - one time 02/04/2017: DIAGNOSTIC MAMMOGRAPHY OF BREAST WITH IMPLANT (08657) : nipple discharge - one time DISCHARGE FROM RIGHT NIPPLE (N64.52)    You continue to have spontaneous clear discharge from your right nipple You report a recent episode of bloody right nipple discharge  Examination today reveals clear discharge from your nipple duct at the 6 o'clock position MRI 6 months ago showed what looked like a small papilloma at the 6 o'clock position Most likely you have a papilloma. Much less likely that she have a cancer, but that is still possible.  I have advised excision of this area through the same inferiorly placed circumareolar scar. We have discussed the risks of nipple flattening, nipple numbness. Smaller risk of skin necrosis. Low but definite risk of injury or rupture of the right breast implant. We have discussed the risk of occult cancer in this setting which is no more than 10%.  We have discussed the indications, techniques, and risk of the surgery in detail. After  lengthy discussion with you and your daughter, you have asked that we go ahead with this conservative biopsy.  Pt Education - Pamphlet Given - Breast Biopsy: discussed with patient and provided information. HISTORY OF RIGHT BREAST IMPLANT (Z98.82) PECTUS EXCAVATUM (Q67.6) HYPERTENSION, ESSENTIAL (I10)    Karinna Beadles M. Dalbert Batman, M.D., [facility] General and Minimally invasive Surgery Breast and Colorectal Surgery Office:   [phone] Pager:   [phone]

## 2017-04-05 NOTE — OR Nursing (Signed)
Boost Breeze given to patient with instructions to consume 2-hours prior to admission. To be completed by 4:30 am on day of surgery. Confirmed patient not on any supplements. Will be NPO after midnight with the exception of Boost Breeze and blood pressure medication taken with sip of H2O prior to admission. Sherron Monday, RN BSN [employer], Armed forces training and education officer

## 2017-04-09 ENCOUNTER — Ambulatory Visit (HOSPITAL_BASED_OUTPATIENT_CLINIC_OR_DEPARTMENT_OTHER): Payer: 59 | Admitting: Anesthesiology

## 2017-04-09 ENCOUNTER — Encounter (HOSPITAL_BASED_OUTPATIENT_CLINIC_OR_DEPARTMENT_OTHER)
Admission: RE | Disposition: A | Discharge status: Home / Self Care | Payer: Self-pay | Source: Ambulatory Visit | Attending: General Surgery

## 2017-04-09 ENCOUNTER — Encounter (HOSPITAL_BASED_OUTPATIENT_CLINIC_OR_DEPARTMENT_OTHER): Payer: Self-pay

## 2017-04-09 ENCOUNTER — Ambulatory Visit (HOSPITAL_BASED_OUTPATIENT_CLINIC_OR_DEPARTMENT_OTHER)
Admission: RE | Admit: 2017-04-09 | Discharge: 2017-04-09 | Disposition: A | Discharge status: Home / Self Care | Payer: 59 | Source: Ambulatory Visit | Attending: General Surgery | Admitting: General Surgery

## 2017-04-09 DIAGNOSIS — Q676 Pectus excavatum: Secondary | ICD-10-CM | POA: Insufficient documentation

## 2017-04-09 DIAGNOSIS — N6452 Nipple discharge: Secondary | ICD-10-CM | POA: Diagnosis present

## 2017-04-09 DIAGNOSIS — I10 Essential (primary) hypertension: Secondary | ICD-10-CM | POA: Diagnosis not present

## 2017-04-09 DIAGNOSIS — D249 Benign neoplasm of unspecified breast: Secondary | ICD-10-CM | POA: Diagnosis not present

## 2017-04-09 DIAGNOSIS — N6011 Diffuse cystic mastopathy of right breast: Secondary | ICD-10-CM | POA: Diagnosis not present

## 2017-04-09 DIAGNOSIS — F1290 Cannabis use, unspecified, uncomplicated: Secondary | ICD-10-CM | POA: Diagnosis not present

## 2017-04-09 DIAGNOSIS — Z9882 Breast implant status: Secondary | ICD-10-CM | POA: Insufficient documentation

## 2017-04-09 HISTORY — PX: BREAST DUCTAL SYSTEM EXCISION: SHX5242

## 2017-04-09 HISTORY — DX: Benign neoplasm of right breast: D24.1

## 2017-04-09 HISTORY — DX: Essential (primary) hypertension: I10

## 2017-04-09 HISTORY — DX: Nipple discharge: N64.52

## 2017-04-09 SURGERY — EXCISION DUCTAL SYSTEM BREAST
Anesthesia: General | Site: Breast | Laterality: Right

## 2017-04-09 MED ORDER — MIDAZOLAM HCL 5 MG/5ML IJ SOLN
INTRAMUSCULAR | Status: DC | PRN
  Administered 2017-04-09: 2 mg via INTRAVENOUS

## 2017-04-09 MED ORDER — HYDROMORPHONE HCL 1 MG/ML IJ SOLN: 0.2500 mg | INTRAMUSCULAR | Status: DC | PRN

## 2017-04-09 MED ORDER — GABAPENTIN 300 MG PO CAPS
ORAL_CAPSULE | ORAL | Status: AC
  Filled 2017-04-09: qty 1

## 2017-04-09 MED ORDER — METHYLENE BLUE 0.5 % INJ SOLN
INTRAVENOUS | Status: AC
  Filled 2017-04-09: qty 10

## 2017-04-09 MED ORDER — MIDAZOLAM HCL 2 MG/2ML IJ SOLN: 1.0000 mg | INTRAMUSCULAR | Status: DC | PRN

## 2017-04-09 MED ORDER — CEFAZOLIN SODIUM-DEXTROSE 2-4 GM/100ML-% IV SOLN
INTRAVENOUS | Status: AC
  Filled 2017-04-09: qty 100

## 2017-04-09 MED ORDER — LACTATED RINGERS IV SOLN
INTRAVENOUS | Status: DC
  Administered 2017-04-09 (×2): via INTRAVENOUS

## 2017-04-09 MED ORDER — BUPIVACAINE-EPINEPHRINE 0.5% -1:200000 IJ SOLN
INTRAMUSCULAR | Status: DC | PRN
  Administered 2017-04-09: 6 mL

## 2017-04-09 MED ORDER — ACETAMINOPHEN 500 MG PO TABS
1000.0000 mg | ORAL_TABLET | ORAL | Status: AC
  Administered 2017-04-09: 1000 mg via ORAL

## 2017-04-09 MED ORDER — FENTANYL CITRATE (PF) 100 MCG/2ML IJ SOLN: 50.0000 ug | INTRAMUSCULAR | Status: DC | PRN

## 2017-04-09 MED ORDER — SODIUM CHLORIDE 0.9 % IJ SOLN
INTRAMUSCULAR | Status: AC
  Filled 2017-04-09: qty 10

## 2017-04-09 MED ORDER — DEXAMETHASONE SODIUM PHOSPHATE 4 MG/ML IJ SOLN
INTRAMUSCULAR | Status: DC | PRN
  Administered 2017-04-09: 10 mg via INTRAVENOUS

## 2017-04-09 MED ORDER — DEXAMETHASONE SODIUM PHOSPHATE 10 MG/ML IJ SOLN
INTRAMUSCULAR | Status: AC
  Filled 2017-04-09: qty 1

## 2017-04-09 MED ORDER — BUPIVACAINE-EPINEPHRINE (PF) 0.5% -1:200000 IJ SOLN
INTRAMUSCULAR | Status: AC
  Filled 2017-04-09: qty 120

## 2017-04-09 MED ORDER — HYDROCODONE-ACETAMINOPHEN 5-325 MG PO TABS: 1.0000 | ORAL_TABLET | Freq: Four times a day (QID) | ORAL | 0 refills | Status: DC | PRN

## 2017-04-09 MED ORDER — FENTANYL CITRATE (PF) 100 MCG/2ML IJ SOLN
INTRAMUSCULAR | Status: DC | PRN
  Administered 2017-04-09: 50 ug via INTRAVENOUS
  Administered 2017-04-09 (×2): 25 ug via INTRAVENOUS

## 2017-04-09 MED ORDER — LIDOCAINE HCL (CARDIAC) 20 MG/ML IV SOLN
INTRAVENOUS | Status: DC | PRN
  Administered 2017-04-09: 60 mg via INTRAVENOUS

## 2017-04-09 MED ORDER — PROPOFOL 10 MG/ML IV BOLUS
INTRAVENOUS | Status: AC
  Filled 2017-04-09: qty 40

## 2017-04-09 MED ORDER — CELECOXIB 400 MG PO CAPS
400.0000 mg | ORAL_CAPSULE | ORAL | Status: AC
  Administered 2017-04-09: 400 mg via ORAL

## 2017-04-09 MED ORDER — MIDAZOLAM HCL 2 MG/2ML IJ SOLN
INTRAMUSCULAR | Status: AC
  Filled 2017-04-09: qty 2

## 2017-04-09 MED ORDER — ACETAMINOPHEN 500 MG PO TABS
ORAL_TABLET | ORAL | Status: AC
  Filled 2017-04-09: qty 2

## 2017-04-09 MED ORDER — CHLORHEXIDINE GLUCONATE CLOTH 2 % EX PADS: 6.0000 | MEDICATED_PAD | Freq: Once | CUTANEOUS | Status: DC

## 2017-04-09 MED ORDER — FENTANYL CITRATE (PF) 100 MCG/2ML IJ SOLN
INTRAMUSCULAR | Status: AC
  Filled 2017-04-09: qty 2

## 2017-04-09 MED ORDER — GABAPENTIN 300 MG PO CAPS
300.0000 mg | ORAL_CAPSULE | ORAL | Status: AC
  Administered 2017-04-09: 300 mg via ORAL

## 2017-04-09 MED ORDER — ONDANSETRON HCL 4 MG/2ML IJ SOLN
INTRAMUSCULAR | Status: DC | PRN
  Administered 2017-04-09: 4 mg via INTRAVENOUS

## 2017-04-09 MED ORDER — PROPOFOL 10 MG/ML IV BOLUS
INTRAVENOUS | Status: DC | PRN
  Administered 2017-04-09: 150 mg via INTRAVENOUS
  Administered 2017-04-09: 50 mg via INTRAVENOUS

## 2017-04-09 MED ORDER — LIDOCAINE 2% (20 MG/ML) 5 ML SYRINGE
INTRAMUSCULAR | Status: AC
  Filled 2017-04-09: qty 5

## 2017-04-09 MED ORDER — EPHEDRINE SULFATE 50 MG/ML IJ SOLN
INTRAMUSCULAR | Status: DC | PRN
  Administered 2017-04-09: 10 mg via INTRAVENOUS

## 2017-04-09 MED ORDER — SCOPOLAMINE 1 MG/3DAYS TD PT72: 1.0000 | MEDICATED_PATCH | Freq: Once | TRANSDERMAL | Status: DC | PRN

## 2017-04-09 MED ORDER — ONDANSETRON HCL 4 MG/2ML IJ SOLN
INTRAMUSCULAR | Status: AC
  Filled 2017-04-09: qty 2

## 2017-04-09 MED ORDER — CEFAZOLIN SODIUM-DEXTROSE 2-4 GM/100ML-% IV SOLN
2.0000 g | INTRAVENOUS | Status: AC
  Administered 2017-04-09: 2 g via INTRAVENOUS

## 2017-04-09 MED ORDER — KETOROLAC TROMETHAMINE 30 MG/ML IJ SOLN
INTRAMUSCULAR | Status: DC | PRN
  Administered 2017-04-09: 30 mg via INTRAVENOUS

## 2017-04-09 MED ORDER — CELECOXIB 200 MG PO CAPS
ORAL_CAPSULE | ORAL | Status: AC
  Filled 2017-04-09: qty 2

## 2017-04-09 SURGICAL SUPPLY — 61 items
ADH SKN CLS APL DERMABOND .7 (GAUZE/BANDAGES/DRESSINGS) ×1
APL SKNCLS STERI-STRIP NONHPOA (GAUZE/BANDAGES/DRESSINGS)
APPLIER CLIP 9.375 MED OPEN (MISCELLANEOUS)
APR CLP MED 9.3 20 MLT OPN (MISCELLANEOUS)
BANDAGE ACE 6X5 VEL STRL LF (GAUZE/BANDAGES/DRESSINGS) IMPLANT
BENZOIN TINCTURE PRP APPL 2/3 (GAUZE/BANDAGES/DRESSINGS) IMPLANT
BINDER BREAST LRG (GAUZE/BANDAGES/DRESSINGS) ×2 IMPLANT
BLADE HEX COATED 2.75 (ELECTRODE) ×3 IMPLANT
BLADE SURG 15 STRL LF DISP TIS (BLADE) ×2 IMPLANT
BLADE SURG 15 STRL SS (BLADE) ×3
CANISTER SUCT 1200ML W/VALVE (MISCELLANEOUS) ×3 IMPLANT
CHLORAPREP W/TINT 26ML (MISCELLANEOUS) ×3 IMPLANT
CLIP APPLIE 9.375 MED OPEN (MISCELLANEOUS) ×1 IMPLANT
CLOSURE WOUND 1/2 X4 (GAUZE/BANDAGES/DRESSINGS)
COVER BACK TABLE 60X90IN (DRAPES) ×3 IMPLANT
COVER MAYO STAND STRL (DRAPES) ×3 IMPLANT
DECANTER SPIKE VIAL GLASS SM (MISCELLANEOUS) IMPLANT
DERMABOND ADVANCED (GAUZE/BANDAGES/DRESSINGS) ×2
DERMABOND ADVANCED .7 DNX12 (GAUZE/BANDAGES/DRESSINGS) IMPLANT
DEVICE DUBIN W/COMP PLATE 8390 (MISCELLANEOUS) IMPLANT
DRAPE LAPAROSCOPIC ABDOMINAL (DRAPES) IMPLANT
DRAPE LAPAROTOMY 100X72 PEDS (DRAPES) ×3 IMPLANT
DRAPE LAPAROTOMY TRNSV 102X78 (DRAPE) IMPLANT
DRAPE UTILITY XL STRL (DRAPES) ×3 IMPLANT
ELECT REM PT RETURN 9FT ADLT (ELECTROSURGICAL) ×3
ELECTRODE REM PT RTRN 9FT ADLT (ELECTROSURGICAL) ×1 IMPLANT
GAUZE SPONGE 4X4 12PLY STRL LF (GAUZE/BANDAGES/DRESSINGS) IMPLANT
GAUZE SPONGE 4X4 16PLY XRAY LF (GAUZE/BANDAGES/DRESSINGS) IMPLANT
GLOVE EUDERMIC 7 POWDERFREE (GLOVE) ×3 IMPLANT
GOWN STRL REUS W/ TWL LRG LVL3 (GOWN DISPOSABLE) ×1 IMPLANT
GOWN STRL REUS W/ TWL XL LVL3 (GOWN DISPOSABLE) ×1 IMPLANT
GOWN STRL REUS W/TWL LRG LVL3 (GOWN DISPOSABLE) ×3
GOWN STRL REUS W/TWL XL LVL3 (GOWN DISPOSABLE) ×3
KIT MARKER MARGIN INK (KITS) IMPLANT
NDL HYPO 25X1 1.5 SAFETY (NEEDLE) ×1 IMPLANT
NEEDLE HYPO 22GX1.5 SAFETY (NEEDLE) IMPLANT
NEEDLE HYPO 25X1 1.5 SAFETY (NEEDLE) ×3 IMPLANT
NS IRRIG 1000ML POUR BTL (IV SOLUTION) ×3 IMPLANT
PACK BASIN DAY SURGERY FS (CUSTOM PROCEDURE TRAY) ×3 IMPLANT
PENCIL BUTTON HOLSTER BLD 10FT (ELECTRODE) ×3 IMPLANT
SLEEVE SCD COMPRESS KNEE MED (MISCELLANEOUS) ×2 IMPLANT
SPONGE LAP 4X18 X RAY DECT (DISPOSABLE) ×3 IMPLANT
STAPLER VISISTAT 35W (STAPLE) IMPLANT
STRIP CLOSURE SKIN 1/2X4 (GAUZE/BANDAGES/DRESSINGS) IMPLANT
SUT ETHILON 4 0 PS 2 18 (SUTURE) IMPLANT
SUT MNCRL AB 4-0 PS2 18 (SUTURE) ×2 IMPLANT
SUT SILK 2 0 SH (SUTURE) ×3 IMPLANT
SUT VIC AB 2-0 SH 27 (SUTURE)
SUT VIC AB 2-0 SH 27XBRD (SUTURE) IMPLANT
SUT VIC AB 3-0 FS2 27 (SUTURE) IMPLANT
SUT VIC AB 4-0 P-3 18XBRD (SUTURE) IMPLANT
SUT VIC AB 4-0 P3 18 (SUTURE)
SUT VICRYL 3-0 CR8 SH (SUTURE) ×3 IMPLANT
SUT VICRYL 4-0 PS2 18IN ABS (SUTURE) IMPLANT
SYR 10ML LL (SYRINGE) ×3 IMPLANT
SYR BULB 3OZ (MISCELLANEOUS) IMPLANT
TAPE HYPAFIX 4 X10 (GAUZE/BANDAGES/DRESSINGS) IMPLANT
TOWEL OR NON WOVEN STRL DISP B (DISPOSABLE) ×3 IMPLANT
TUBE CONNECTING 20'X1/4 (TUBING) ×1
TUBE CONNECTING 20X1/4 (TUBING) ×2 IMPLANT
YANKAUER SUCT BULB TIP NO VENT (SUCTIONS) ×3 IMPLANT

## 2017-04-09 NOTE — Interval H&P Note (Signed)
History and Physical Interval Note:  04/09/2017 6:44 AM  Wadley  has presented today for surgery, with the diagnosis of RIGHT BREAST PAPILLOMA  The various methods of treatment have been discussed with the patient and family. After consideration of risks, benefits and other options for treatment, the patient has consented to  Procedure(s): [place] (Right) as a surgical intervention .  The patient's history has been reviewed, patient examined, no change in status, stable for surgery.  I have reviewed the patient's chart and labs.  Questions were answered to the patient's satisfaction.     Adin Hector

## 2017-04-09 NOTE — Anesthesia Preprocedure Evaluation (Addendum)
Anesthesia Evaluation  Patient identified by MRN, date of birth, ID band Patient awake    Reviewed: Allergy & Precautions, H&P , NPO status , Patient's Chart, lab work & pertinent test results  Airway Mallampati: I  TM Distance: >3 FB Neck ROM: Full    Dental no notable dental hx. (+) Teeth Intact, Dental Advisory Given   Pulmonary neg pulmonary ROS,    Pulmonary exam normal breath sounds clear to auscultation       Cardiovascular hypertension, Pt. on medications  Rhythm:Regular Rate:Normal     Neuro/Psych negative neurological ROS  negative psych ROS   GI/Hepatic negative GI ROS, Neg liver ROS,   Endo/Other  negative endocrine ROS  Renal/GU negative Renal ROS  negative genitourinary   Musculoskeletal   Abdominal   Peds  Hematology negative hematology ROS (+)   Anesthesia Other Findings   Reproductive/Obstetrics negative OB ROS                            Anesthesia Physical Anesthesia Plan  ASA: II  Anesthesia Plan: General   Post-op Pain Management:    Induction: Intravenous  PONV Risk Score and Plan: 4 or greater and Ondansetron, Dexamethasone and Midazolam  Airway Management Planned: LMA  Additional Equipment:   Intra-op Plan:   Post-operative Plan: Extubation in OR  Informed Consent: I have reviewed the patients History and Physical, chart, labs and discussed the procedure including the risks, benefits and alternatives for the proposed anesthesia with the patient or authorized representative who has indicated his/her understanding and acceptance.   Dental advisory given  Plan Discussed with: CRNA  Anesthesia Plan Comments:         Anesthesia Quick Evaluation

## 2017-04-09 NOTE — Anesthesia Procedure Notes (Signed)
Procedure Name: LMA Insertion Date/Time: 04/09/2017 7:29 AM Performed by: Justice Rocher Pre-anesthesia Checklist: Patient identified, Emergency Drugs available, Suction available and Patient being monitored Patient Re-evaluated:Patient Re-evaluated prior to induction Oxygen Delivery Method: Circle system utilized Preoxygenation: Pre-oxygenation with 100% oxygen Induction Type: IV induction Ventilation: Mask ventilation without difficulty LMA: LMA inserted LMA Size: 4.0 Number of attempts: 1 Airway Equipment and Method: Bite block Placement Confirmation: positive ETCO2 and breath sounds checked- equal and bilateral Tube secured with: Tape Dental Injury: Teeth and Oropharynx as per pre-operative assessment

## 2017-04-09 NOTE — Transfer of Care (Signed)
Immediate Anesthesia Transfer of Care Note  Patient: Kim Chang  Procedure(s) Performed: Procedure(s) (LRB): EXCISION DUCTAL SYSTEM RIGHT BREAST (Right)  Patient Location: PACU  Anesthesia Type: General  Level of Consciousness: awake, sedated, patient cooperative and responds to stimulation  Airway & Oxygen Therapy: Patient Spontanous Breathing and Patient connected to RA   Post-op Assessment: Report given to PACU RN, Post -op Vital signs reviewed and stable and Patient moving all extremities  Post vital signs: Reviewed and stable  Complications: No apparent anesthesia complications

## 2017-04-09 NOTE — Discharge Instructions (Signed)
[facility] °Office Phone Number [phone] ° °BREAST BIOPSY/ PARTIAL MASTECTOMY: POST OP INSTRUCTIONS ° °Always review your discharge instruction sheet given to you by the facility where your surgery was performed. ° °IF YOU HAVE DISABILITY OR FAMILY LEAVE FORMS, YOU MUST BRING THEM TO THE OFFICE FOR PROCESSING.  DO NOT GIVE THEM TO YOUR DOCTOR. ° °1. A prescription for pain medication may be given to you upon discharge.  Take your pain medication as prescribed, if needed.  If narcotic pain medicine is not needed, then you may take acetaminophen (Tylenol) or ibuprofen (Advil) as needed. °2. Take your usually prescribed medications unless otherwise directed °3. If you need a refill on your pain medication, please contact your pharmacy.  They will contact our office to request authorization.  Prescriptions will not be filled after 5pm or on week-ends. °4. You should eat very light the first 24 hours after surgery, such as soup, crackers, pudding, etc.  Resume your normal diet the day after surgery. °5. Most patients will experience some swelling and bruising in the breast.  Ice packs and a good support bra will help.  Swelling and bruising can take several days to resolve.  °6. It is common to experience some constipation if taking pain medication after surgery.  Increasing fluid intake and taking a stool softener will usually help or prevent this problem from occurring.  A mild laxative (Milk of Magnesia or Miralax) should be taken according to package directions if there are no bowel movements after 48 hours. °7. Unless discharge instructions indicate otherwise, you may remove your bandages 24-48 hours after surgery, and you may shower at that time.  You may have steri-strips (small skin tapes) in place directly over the incision.  These strips should be left on the skin for 7-10 days.  If your surgeon used skin glue on the incision, you may shower in 24 hours.  The glue will flake off over the  next 2-3 weeks.  Any sutures or staples will be removed at the office during your follow-up visit. °8. ACTIVITIES:  You may resume regular daily activities (gradually increasing) beginning the next day.  Wearing a good support bra or sports bra minimizes pain and swelling.  You may have sexual intercourse when it is comfortable. °a. You may drive when you no longer are taking prescription pain medication, you can comfortably wear a seatbelt, and you can safely maneuver your car and apply brakes. °b. RETURN TO WORK:  ______________________________________________________________________________________ °9. You should see your doctor in the office for a follow-up appointment approximately two weeks after your surgery.  Your doctor’s nurse will typically make your follow-up appointment when she calls you with your pathology report.  Expect your pathology report 2-3 business days after your surgery.  You may call to check if you do not hear from us after three days. °10. OTHER INSTRUCTIONS: _______________________________________________________________________________________________ _____________________________________________________________________________________________________________________________________ °_____________________________________________________________________________________________________________________________________ °_____________________________________________________________________________________________________________________________________ ° °WHEN TO CALL YOUR DOCTOR: °1. Fever over 101.0 °2. Nausea and/or vomiting. °3. Extreme swelling or bruising. °4. Continued bleeding from incision. °5. Increased pain, redness, or drainage from the incision. ° °The clinic staff is available to answer your questions during regular business hours.  Please don’t hesitate to call and ask to speak to one of the nurses for clinical concerns.  If you have a medical emergency, go to the nearest  emergency room or call 911.  A surgeon from [facility] is always on call at the hospital. ° °For further questions, please visit [email or web address]  ° ° ° ° °  Post Anesthesia Home Care Instructions ° °Activity: °Get plenty of rest for the remainder of the day. A responsible individual must stay with you for 24 hours following the procedure.  °For the next 24 hours, DO NOT: °-Drive a car °-Operate machinery °-Drink alcoholic beverages °-Take any medication unless instructed by your physician °-Make any legal decisions or sign important papers. ° °Meals: °Start with liquid foods such as gelatin or soup. Progress to regular foods as tolerated. Avoid greasy, spicy, heavy foods. If nausea and/or vomiting occur, drink only clear liquids until the nausea and/or vomiting subsides. Call your physician if vomiting continues. ° °Special Instructions/Symptoms: °Your throat may feel dry or sore from the anesthesia or the breathing tube placed in your throat during surgery. If this causes discomfort, gargle with warm salt water. The discomfort should disappear within 24 hours. ° °If you had a scopolamine patch placed behind your ear for the management of post- operative nausea and/or vomiting: ° °1. The medication in the patch is effective for 72 hours, after which it should be removed.  Wrap patch in a tissue and discard in the trash. Wash hands thoroughly with soap and water. °2. You may remove the patch earlier than 72 hours if you experience unpleasant side effects which may include dry mouth, dizziness or visual disturbances. °3. Avoid touching the patch. Wash your hands with soap and water after contact with the patch. °  ° °

## 2017-04-09 NOTE — Anesthesia Postprocedure Evaluation (Signed)
Anesthesia Post Note  Patient: Kim Chang  Procedure(s) Performed: Procedure(s) (LRB): EXCISION DUCTAL SYSTEM RIGHT BREAST (Right)     Patient location during evaluation: PACU Anesthesia Type: General Level of consciousness: awake and alert Pain management: pain level controlled Vital Signs Assessment: post-procedure vital signs reviewed and stable Respiratory status: spontaneous breathing, nonlabored ventilation and respiratory function stable Cardiovascular status: blood pressure returned to baseline and stable Postop Assessment: no signs of nausea or vomiting Anesthetic complications: no    Last Vitals:  Vitals:   04/09/17 0845 04/09/17 0900  BP: 112/74 126/74  Pulse: 64 69  Resp: 18 16  Temp:  36.4 C    Last Pain:  Vitals:   04/09/17 0900  TempSrc:   PainSc: 0-No pain                 Mirella Gueye,W. EDMOND

## 2017-04-09 NOTE — Op Note (Signed)
Patient Name:           Kim Chang   Date of Surgery:        04/09/2017  Pre op Diagnosis:      Right nipple discharge  Post op Diagnosis:    Right nipple discharge, recurrent  Procedure:                 Excision ductal system right breast, 6:00 position, lumpectomy with margin assessment  Surgeon:                     Edsel Petrin. Dalbert Batman, M.D., FACS  Assistant:                      OR staff   Indication for Assistant: N/A  Operative Indications:   This is a 56 year old Caucasian female who returns to discuss right nipple discharge.  Dr. Gaetano Net is her gynecologist. Dr. Jacelyn Grip is her PCP. Dr. Einar Gip is her cardiologist       Many years ago she underwent unilateral right subpectoral implant because of pectus excavatum and got a good result with symmetry by Dr. Wendy Poet. In 2009 I went back through the same inferiorly placed circumareolar incision for nipple discharge and found a benign papilloma and she did well. I saw her 6 months ago at which time she was complaining of nipple discharge on the right for 3 months. She doesn't see the discharge but sometimes sees clear or green spots in her bra. She had imaging studies 6 months ago including mammogram and MRI. No abnormalities on routine imaging. MRI saw a 5 mm oval mass at the 6 o'clock position of the nipple. Not amenable to image guided biopsy. She was referred for excision. She declined excision at that time and I offered to see her back in 6 months.       In the interim she continued to have spontaneous clear discharge that she sees the spots in her bra. She had one episode recently of bloody discharge and came to talk about surgical management.  Past history reveals the 2 breast surgeries as discussed above.   Does not smoke cigarettes but does smoke marijuana occasionally.      We had a long talk along with her daughter. I told her the standard of care here was excision of the involved nipple duct. I explained general  anesthesia, use of lacrimal duct probes, inferiorly placed incision to avoid further damage to blood supply. I told her that my biggest concern was nipple sensory deprivation and flattening. Also risk of bleeding infection and injury or rupture of her implant. I went over the indications, details, techniques, and numerous risk of the surgery. She understands all of these issues. All of her questions were answered. She agrees with this plan wants to go ahead.  Operative Findings:       There was no palpable abnormality.  I was able to easily elicit a clear discharge from a nipple duct at the 6:00 position.  I was able to insert a small lacrimal duct probe into this for about 2.5 cm and it went directly posteriorly and inferiorly and then stopped.  Procedure in Detail:          Following the induction of general LMA anesthesia the patient's right breast was prepped and draped in a sterile fashion.  Surgical timeout was performed.  Intravenous antibiotics were given.  0.5% Marcaine with epinephrine was carefully infiltrated into the superficial  subcutaneous tissues.  I made a circumareolar incision at the areolar margin, through the previous scar.  I lifted up the nipple and areolar complex and carefully dissected all of the subareolar tissue using electrocautery exclusively.  I took out about a 2.5 cm diameter disc of tissue.  I took the dissection up to the subdermal area of the nipple.  I basically took out most of the subareolar tissue.  In some areas I could see that I was close to the pectoralis muscle.  The implant was not encountered.  The specimen was removed and marked with silk sutures and a 6 color ink kit to orient the pathologist.  The specimen was then sent to the lab.  Hemostasis was excellent.  The wound was irrigated.  The breast tissues were closed in layers with interrupted 3-0 Vicryl sutures and skin closed with a running subcuticular 4-0 Monocryl, and Steri-Strips.  Dry bandages and a  breast binder were placed.  Patient tolerated the procedure well was taken to PACU in stable condition.  EBL 5 mL.  Counts correct.  Complications none.       Edsel Petrin. Dalbert Batman, M.D., FACS General and Minimally Invasive Surgery Breast and Colorectal Surgery    Addendum: I logged onto the [facility] website and reviewed her prescription medication history.  04/09/2017 8:21 AM

## 2017-04-10 ENCOUNTER — Encounter (HOSPITAL_BASED_OUTPATIENT_CLINIC_OR_DEPARTMENT_OTHER): Payer: Self-pay | Admitting: General Surgery

## 2017-07-01 ENCOUNTER — Ambulatory Visit: Payer: 59 | Admitting: Allergy and Immunology

## 2017-07-23 ENCOUNTER — Ambulatory Visit: Payer: 59 | Admitting: Allergy and Immunology

## 2017-07-23 ENCOUNTER — Other Ambulatory Visit: Payer: Self-pay

## 2017-07-23 ENCOUNTER — Encounter: Payer: Self-pay | Admitting: Allergy and Immunology

## 2017-07-23 VITALS — BP 110/70 | HR 88 | Temp 97.7°F | Resp 16 | Ht 63.5 in | Wt 152.0 lb

## 2017-07-23 DIAGNOSIS — J310 Chronic rhinitis: Secondary | ICD-10-CM | POA: Diagnosis not present

## 2017-07-23 DIAGNOSIS — L500 Allergic urticaria: Secondary | ICD-10-CM

## 2017-07-23 MED ORDER — LEVOCETIRIZINE DIHYDROCHLORIDE 5 MG PO TABS: 5.0000 mg | ORAL_TABLET | Freq: Every evening | ORAL | 5 refills | Status: AC

## 2017-07-23 MED ORDER — CARBINOXAMINE MALEATE 6 MG PO TABS: 6.0000 mg | ORAL_TABLET | Freq: Two times a day (BID) | ORAL | 5 refills | Status: AC

## 2017-07-23 MED ORDER — CARBINOXAMINE MALEATE 6 MG PO TABS: 6.0000 mg | ORAL_TABLET | Freq: Two times a day (BID) | ORAL | 5 refills | Status: DC

## 2017-07-23 MED ORDER — AZELASTINE HCL 0.15 % NA SOLN: 2.0000 | Freq: Every day | NASAL | 5 refills | Status: AC | PRN

## 2017-07-23 NOTE — Addendum Note (Signed)
Addended by: Orlene Erm on: 07/23/2017 04:16 PM   Modules accepted: Orders

## 2017-07-23 NOTE — Patient Instructions (Addendum)
Recurrent urticaria Unclear etiology. Skin tests to select food allergens were negative today. NSAIDs and emotional stress commonly exacerbate urticaria but are not the underlying etiology in this case. Physical urticarias are negative by history (i.e. pressure-induced, temperature, vibration, solar, etc.). There are no concomitant symptoms concerning for anaphylaxis or constitutional symptoms worrisome for an underlying malignancy. We will rule out other potential etiologies with labs. For symptom relief, patient is to take oral antihistamines as directed.  The following labs have been ordered: FCeRI antibody, TSH, anti-thyroglobulin antibody, thyroid peroxidase antibody, tryptase, CBC, CMP, ESR, ANA, and galactose-alpha-1,3-galactose IgE level.  The patient will be called with further recommendations after lab results have returned.  Instructions have been discussed and provided for H1/H2 receptor blockade with titration to find lowest effective dose.  A prescription has been provided for levocetirizine, 5 mg daily as needed.  Should there be a significant increase or change in symptoms, a journal is to be kept recording any foods eaten, beverages consumed, medications taken within a 6 hour period prior to the onset of symptoms, as well as record activities being performed, and environmental conditions. For any symptoms concerning for anaphylaxis, 911 is to be called immediately.  Chronic rhinitis All seasonal and perennial aeroallergen skin tests are negative despite a positive histamine control.   A prescription has been provided for azelastine nasal spray, one spray per nostril 1-2 times daily as needed. Proper nasal spray technique has been discussed and demonstrated.  Nasal saline lavage (NeilMed) has been recommended as needed and prior to medicated nasal sprays along with instructions for proper administration.  A prescription has been provided for RyVent (carbinoxamine maleate) '6mg'$   every 6-8 hours as needed.   When lab results have returned the patient will be called with further recommendations and follow up instructions.  Urticaria (Hives)  . Levocetirizine (Xyzal) 5 mg twice a day and ranitidine (Zantac) 150 mg twice a day. If no symptoms for 7-14 days then decrease to. . Levocetirizine (Xyzal) 5 mg twice a day and ranitidine (Zantac) 150 mg once a day.  If no symptoms for 7-14 days then decrease to. . Levocetirizine (Xyzal) 5 mg twice a day.  If no symptoms for 7-14 days then decrease to. . Levocetirizine (Xyzal) 5 mg once a day.  May use Benadryl (diphenhydramine) as needed for breakthrough symptoms       If symptoms return, then step up dosage

## 2017-07-23 NOTE — Progress Notes (Signed)
New Patient Note  RE: HASSET CHAVIANO MRN: [id number] DOB: 09-17-1960 Date of Office Visit: 07/23/2017  Referring provider: Vernie Shanks, MD Primary care provider: Vernie Shanks, MD  Chief Complaint: Urticaria; Nasal Congestion; and Sinus Problem   History of present illness: Abisola Carrero is a 56 y.o. female seen today in consultation requested by Yaakov Guthrie, MD.  Over the past 3 years, Katrice has experienced recurrent episodes of hives.  These episodes occur 3 or 4 times per year and last approximately 3 weeks at a time.  The hives have appeared at different times over her back, arms and torso.  The lesions are described as erythematous, raised, and pruritic.  She denies vesicles. She denies concomitant angioedema, cardiopulmonary symptoms and GI symptoms. She has not experienced unexpected weight loss, recurrent fevers or drenching night sweats. No specific medication, food, skin care product, detergent, soap, or other environmental triggers have been identified. The symptoms do not seem to correlate with NSAIDs use or emotional stress. She did not have symptoms consistent with a respiratory tract infection at the time of symptom onset. Kahlee has tried to control symptoms with OTC antihistamines which have offered moderate temporary relief. She has been evaluated and treated in the emergency department for these symptoms. Skin biopsy has not been performed.  Taniqua experiences nasal congestion, thick postnasal drainage, and occasional sinus pressure.  No significant seasonal symptom variation has been noted nor have specific environmental triggers been identified.  She currently attempts to control the symptoms with nasal saline spray.   Assessment and plan: Recurrent urticaria Unclear etiology. Skin tests to select food allergens were negative today. NSAIDs and emotional stress commonly exacerbate urticaria but are not the underlying etiology in this case. Physical urticarias are negative by  history (i.e. pressure-induced, temperature, vibration, solar, etc.). There are no concomitant symptoms concerning for anaphylaxis or constitutional symptoms worrisome for an underlying malignancy. We will rule out other potential etiologies with labs. For symptom relief, patient is to take oral antihistamines as directed.  The following labs have been ordered: FCeRI antibody, TSH, anti-thyroglobulin antibody, thyroid peroxidase antibody, tryptase, CBC, CMP, ESR, ANA, and galactose-alpha-1,3-galactose IgE level.  The patient will be called with further recommendations after lab results have returned.  Instructions have been discussed and provided for H1/H2 receptor blockade with titration to find lowest effective dose.  A prescription has been provided for levocetirizine, 5 mg daily as needed.  Should there be a significant increase or change in symptoms, a journal is to be kept recording any foods eaten, beverages consumed, medications taken within a 6 hour period prior to the onset of symptoms, as well as record activities being performed, and environmental conditions. For any symptoms concerning for anaphylaxis, 911 is to be called immediately.  Chronic rhinitis All seasonal and perennial aeroallergen skin tests are negative despite a positive histamine control.   A prescription has been provided for azelastine nasal spray, one spray per nostril 1-2 times daily as needed. Proper nasal spray technique has been discussed and demonstrated.  Nasal saline lavage (NeilMed) has been recommended as needed and prior to medicated nasal sprays along with instructions for proper administration.  A prescription has been provided for RyVent (carbinoxamine maleate) '6mg'$  every 6-8 hours as needed.   Meds ordered this encounter  Medications  . DISCONTD: Carbinoxamine Maleate (RYVENT) 6 MG TABS    Sig: Take 6 mg 2 (two) times daily by mouth.    Dispense:  30 tablet    Refill:  [place] [id number]   RX PCN  [id number]   Group [id number]   Cardholder ID    [id number] cash pay  . levocetirizine (XYZAL) 5 MG tablet    Sig: Take 1 tablet (5 mg total) every evening by mouth.    Dispense:  30 tablet    Refill:  5  . Azelastine HCl 0.15 % SOLN    Sig: Place 2 sprays daily as needed into both nostrils.    Dispense:  30 mL    Refill:  5  . Carbinoxamine Maleate (RYVENT) 6 MG TABS    Sig: Take 6 mg 2 (two) times daily by mouth.    Dispense:  30 tablet    Refill:  5    BIN [id number]   RX PCN [id number]   Group [id number]   Cardholder ID    [id number] cash pay    Diagnostics: Environmental skin testing: Negative despite a positive histamine control. Food allergen skin testing: Negative despite a positive histamine control.    Physical examination: Pulse 88, temperature 97.7 F (36.5 C), temperature source Oral, resp. rate 16, height 5' 3.5" (1.613 m), weight 152 lb (68.9 kg), last menstrual period 04/19/2014, SpO2 98 %.  General: Alert, interactive, in no acute distress. HEENT: TMs pearly gray, turbinates moderately edematous with thick discharge, post-pharynx mildly erythematous. Neck: Supple without lymphadenopathy. Lungs: Clear to auscultation without wheezing, rhonchi or rales. CV: Normal S1, S2 without murmurs. Abdomen: Nondistended, nontender. Skin: Warm and dry, without lesions or rashes. Extremities:  No clubbing, cyanosis or edema. Neuro:   Grossly intact.  Review of systems:  Review of systems negative except as noted in HPI / PMHx or noted below: Review of Systems  Constitutional: Negative.   HENT: Negative.   Eyes: Negative.   Respiratory: Negative.   Cardiovascular: Negative.   Gastrointestinal: Negative.   Genitourinary: Negative.   Musculoskeletal: Negative.   Skin: Negative.   Neurological: Negative.   Endo/Heme/Allergies: Negative.   Psychiatric/Behavioral: Negative.     Past medical history:  Past Medical History:  Diagnosis Date  . Discharge from right nipple  04/09/2017  . H/O breast implant   . Heel spur   . Hypertension   . Papilloma of right breast   . Pectus excavatum     Past surgical history:  Past Surgical History:  Procedure Laterality Date  . BREAST SURGERY Right 2009  . nipple duct surgery Right 2009  . PLACEMENT OF BREAST IMPLANTS Right 1989   for pectus excavatum  . TONSILLECTOMY Bilateral 1995    Family history: Family History  Problem Relation Age of Onset  . Diabetes Father   . Angioedema Neg Hx   . Allergic rhinitis Neg Hx   . Asthma Neg Hx   . Eczema Neg Hx   . Immunodeficiency Neg Hx   . Urticaria Neg Hx     Social history: Social History   Socioeconomic History  . Marital status: Single    Spouse name: Not on file  . Number of children: Not on file  . Years of education: Not on file  . Highest education level: Not on file  Social Needs  . Financial resource strain: Not on file  . Food insecurity - worry: Not on file  . Food insecurity - inability: Not on file  . Transportation needs - medical: Not on file  . Transportation needs - non-medical: Not on file  Occupational History  . Not on file  Tobacco Use  . Smoking status:  Never Smoker  . Smokeless tobacco: Never Used  Substance and Sexual Activity  . Alcohol use: Yes    Comment: social  . Drug use: Yes    Types: Marijuana    Comment: smokes daily  . Sexual activity: Not on file  Other Topics Concern  . Not on file  Social History Narrative  . Not on file   Environmental History: The patient lives in a house built in 1994 with carpeting throughout, gas heat, and central air.  She has no pets.  There is no known mold/water damage in the home.  She smokes marijuana daily but is not a cigarette smoker.  Allergies as of 07/23/2017   No Known Allergies     Medication List        Accurate as of 07/23/17  4:04 PM. Always use your most recent med list.          Azelastine HCl 0.15 % Soln Place 2 sprays daily as needed into both  nostrils.   Carbinoxamine Maleate 6 MG Tabs Commonly known as:  RYVENT Take 6 mg 2 (two) times daily by mouth.   levocetirizine 5 MG tablet Commonly known as:  XYZAL Take 1 tablet (5 mg total) every evening by mouth.   olmesartan 20 MG tablet Commonly known as:  BENICAR Take 20 mg daily by mouth.       Known medication allergies: No Known Allergies  I appreciate the opportunity to take part in Renn's care. Please do not hesitate to contact me with questions.  Sincerely,   R. Edgar Frisk, MD

## 2017-07-23 NOTE — Assessment & Plan Note (Signed)
All seasonal and perennial aeroallergen skin tests are negative despite a positive histamine control.   A prescription has been provided for azelastine nasal spray, one spray per nostril 1-2 times daily as needed. Proper nasal spray technique has been discussed and demonstrated.  Nasal saline lavage (NeilMed) has been recommended as needed and prior to medicated nasal sprays along with instructions for proper administration.  A prescription has been provided for RyVent (carbinoxamine maleate) 6mg  every 6-8 hours as needed.

## 2017-07-23 NOTE — Assessment & Plan Note (Signed)
Unclear etiology. Skin tests to select food allergens were negative today. NSAIDs and emotional stress commonly exacerbate urticaria but are not the underlying etiology in this case. Physical urticarias are negative by history (i.e. pressure-induced, temperature, vibration, solar, etc.). There are no concomitant symptoms concerning for anaphylaxis or constitutional symptoms worrisome for an underlying malignancy. We will rule out other potential etiologies with labs. For symptom relief, patient is to take oral antihistamines as directed.  The following labs have been ordered: FCeRI antibody, TSH, anti-thyroglobulin antibody, thyroid peroxidase antibody, tryptase, CBC, CMP, ESR, ANA, and galactose-alpha-1,3-galactose IgE level.  The patient will be called with further recommendations after lab results have returned.  Instructions have been discussed and provided for H1/H2 receptor blockade with titration to find lowest effective dose.  A prescription has been provided for levocetirizine, 5 mg daily as needed.  Should there be a significant increase or change in symptoms, a journal is to be kept recording any foods eaten, beverages consumed, medications taken within a 6 hour period prior to the onset of symptoms, as well as record activities being performed, and environmental conditions. For any symptoms concerning for anaphylaxis, 911 is to be called immediately.

## 2017-07-29 ENCOUNTER — Ambulatory Visit: Payer: 59 | Admitting: Allergy and Immunology

## 2017-09-04 ENCOUNTER — Ambulatory Visit
Admission: RE | Admit: 2017-09-04 | Discharge: 2017-09-04 | Disposition: A | Discharge status: Home / Self Care | Payer: 59 | Source: Ambulatory Visit | Attending: Family Medicine | Admitting: Family Medicine

## 2017-09-04 ENCOUNTER — Other Ambulatory Visit: Payer: Self-pay | Admitting: Family Medicine

## 2017-09-04 DIAGNOSIS — M25551 Pain in right hip: Secondary | ICD-10-CM

## 2017-09-10 LAB — COMPREHENSIVE METABOLIC PANEL
ALT: 17 IU/L (ref 0–32)
AST: 23 IU/L (ref 0–40)
Albumin/Globulin Ratio: 2 (ref 1.2–2.2)
Albumin: 4.7 g/dL (ref 3.5–5.5)
Alkaline Phosphatase: 82 IU/L (ref 39–117)
BUN/Creatinine Ratio: 19 (ref 9–23)
BUN: 18 mg/dL (ref 6–24)
Bilirubin Total: 1 mg/dL (ref 0.0–1.2)
CO2: 24 mmol/L (ref 20–29)
Calcium: 9.4 mg/dL (ref 8.7–10.2)
Chloride: 101 mmol/L (ref 96–106)
Creatinine, Ser: 0.95 mg/dL (ref 0.57–1.00)
GFR calc Af Amer: 78 mL/min/{1.73_m2} (ref >59)
GFR calc non Af Amer: 68 mL/min/{1.73_m2} (ref >59)
Globulin, Total: 2.3 g/dL (ref 1.5–4.5)
Glucose: 129 mg/dL — ABNORMAL HIGH (ref 65–99)
Potassium: 3.8 mmol/L (ref 3.5–5.2)
Sodium: 140 mmol/L (ref 134–144)
Total Protein: 7 g/dL (ref 6.0–8.5)

## 2017-09-10 LAB — CBC
Hematocrit: 40.5 % (ref 34.0–46.6)
Hemoglobin: 13.3 g/dL (ref 11.1–15.9)
MCH: 30.6 pg (ref 26.6–33.0)
MCHC: 32.8 g/dL (ref 31.5–35.7)
MCV: 93 fL (ref 79–97)
Platelets: 240 10*3/uL (ref 150–379)
RBC: 4.34 x10E6/uL (ref 3.77–5.28)
RDW: 13.4 % (ref 12.3–15.4)
WBC: 6.3 10*3/uL (ref 3.4–10.8)

## 2017-09-10 LAB — ANA: Anti Nuclear Antibody(ANA): NEGATIVE

## 2017-09-10 LAB — ALPHA-GAL PANEL
Alpha Gal IgE*: 35.6 kU/L — ABNORMAL HIGH (ref <0.10)
Beef (Bos spp) IgE: 18.2 kU/L — ABNORMAL HIGH (ref <0.35)
Class Interpretation: 3
Class Interpretation: 3
Class Interpretation: 4
Lamb/Mutton (Ovis spp) IgE: 7.98 kU/L — ABNORMAL HIGH (ref <0.35)
Pork (Sus spp) IgE: 7.94 kU/L — ABNORMAL HIGH (ref <0.35)

## 2017-09-10 LAB — SEDIMENTATION RATE: Sed Rate: 11 mm/hr (ref 0–40)

## 2017-09-10 LAB — THYROID PEROXIDASE ANTIBODY: Thyroperoxidase Ab SerPl-aCnc: 139 IU/mL — ABNORMAL HIGH (ref 0–34)

## 2017-09-10 LAB — THYROGLOBULIN LEVEL: Thyroglobulin (TG-RIA): 16 ng/mL

## 2017-09-10 LAB — TSH: TSH: 1.44 u[IU]/mL (ref 0.450–4.500)

## 2017-09-10 LAB — CHRONIC URTICARIA: cu index: 10 — ABNORMAL HIGH (ref <10)

## 2017-09-13 ENCOUNTER — Other Ambulatory Visit: Payer: Self-pay

## 2017-09-13 MED ORDER — EPINEPHRINE 0.3 MG/0.3ML IJ SOAJ: 0.3000 mg | Freq: Once | INTRAMUSCULAR | 2 refills | Status: AC

## 2017-10-06 IMAGING — MG 2D DIGITAL DIAGNOSTIC BILATERAL MAMMOGRAM WITH CAD AND ADJUNCT T
8 of 16 series · 8 of 36 positions shown · non-contrast
Comparison: Previous exam(s).

CLINICAL DATA: Patient complains of spontaneous right breast nipple
discharge. Patient has had prior surgery for a papilloma. The
patient has a subpectoral right implant.

EXAM:
2D DIGITAL DIAGNOSTIC BILATERAL MAMMOGRAM WITH IMPLANTS, CAD AND
ADJUNCT TOMO
ULTRASOUND RIGHT BREAST
The patient has retropectoral implants. Standard and implant
displaced views were performed.

[R MLO]
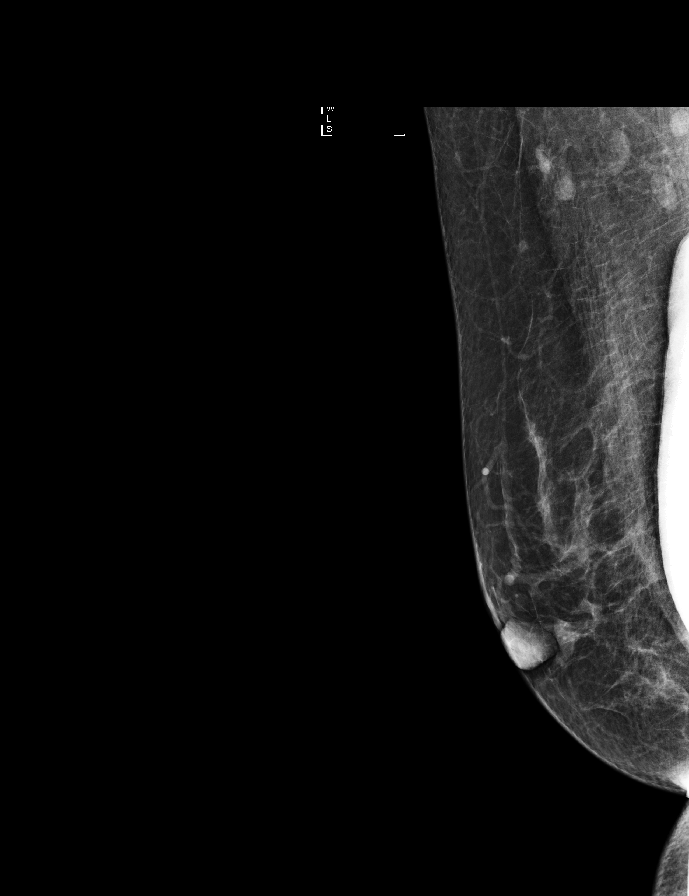

[R CC (1 of 2)]
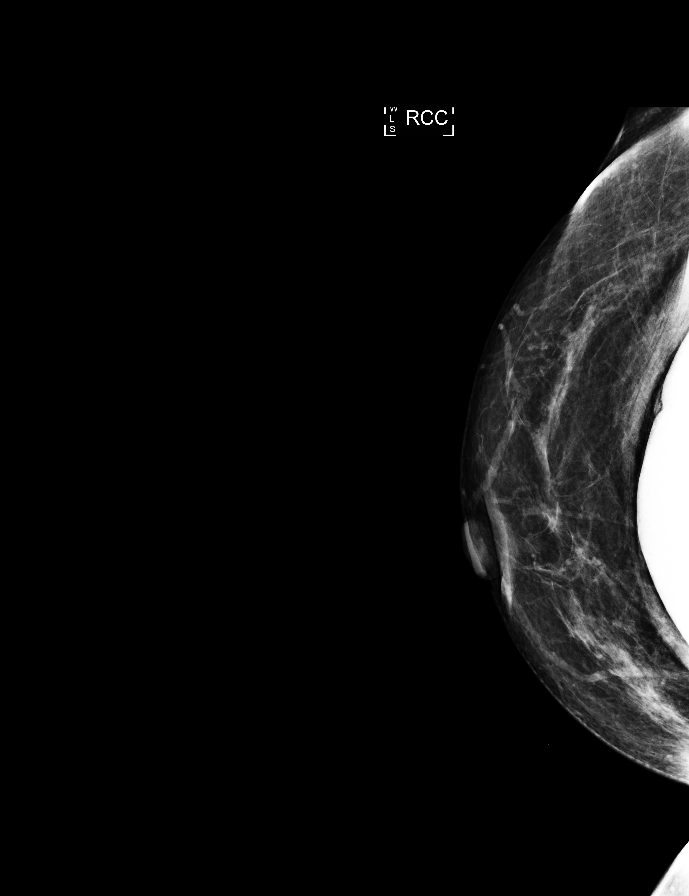

[L CC synth-2D]
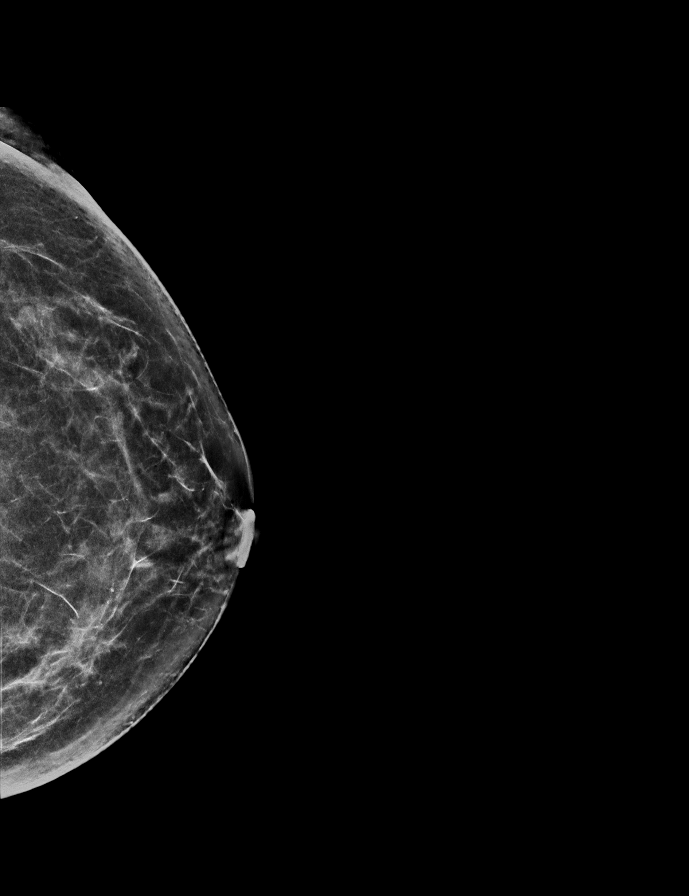

[L MLO synth-2D]
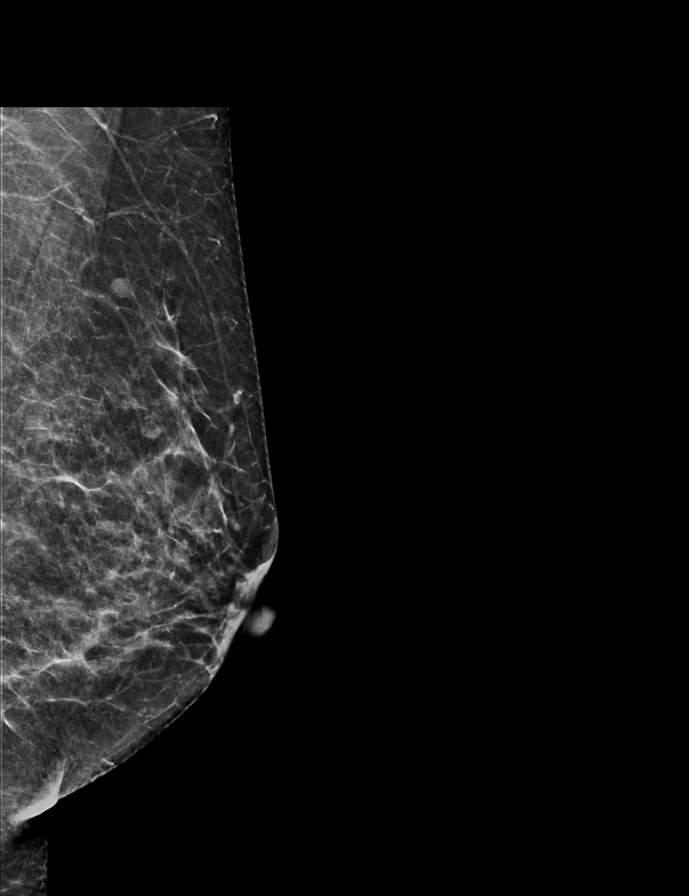

[L MLO]
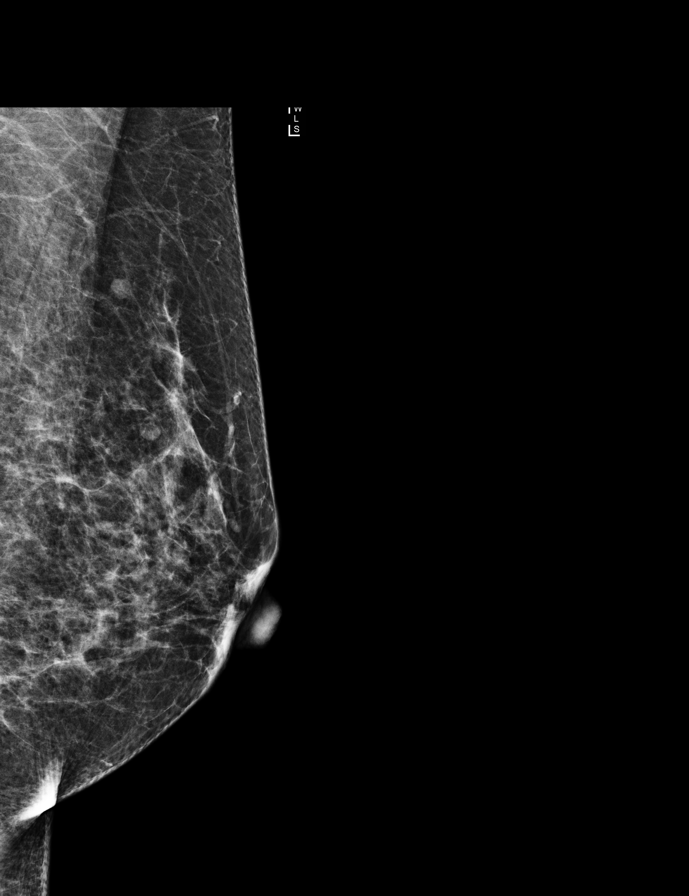

[R MLO synth-2D]
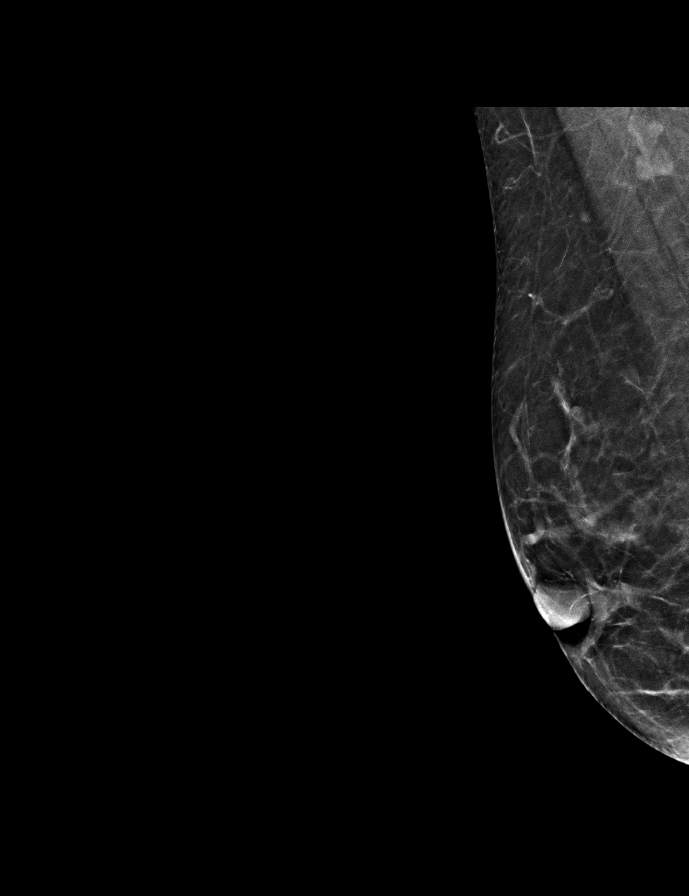

[R TAN]
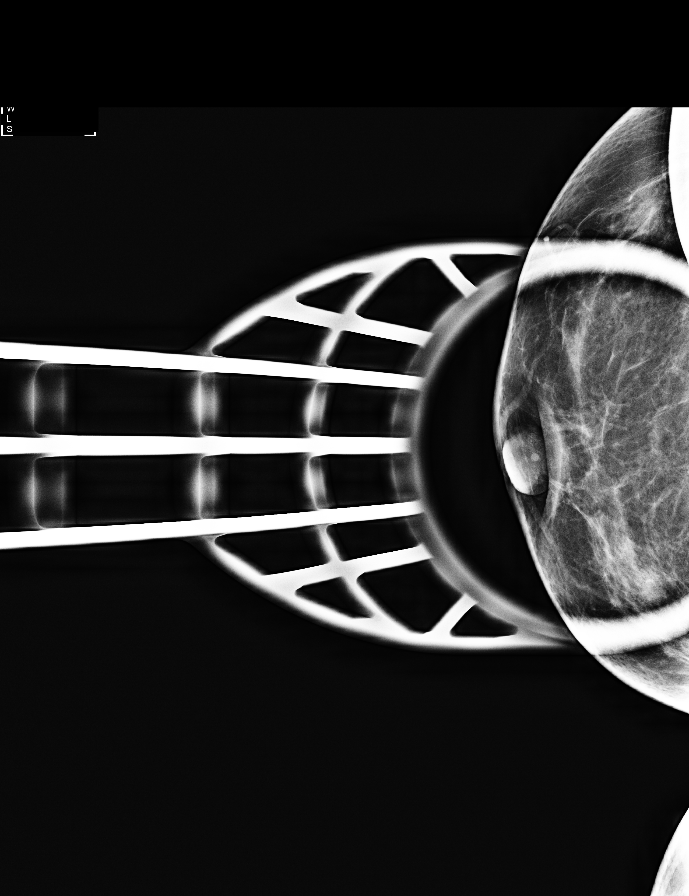

[R CC (2 of 2)]
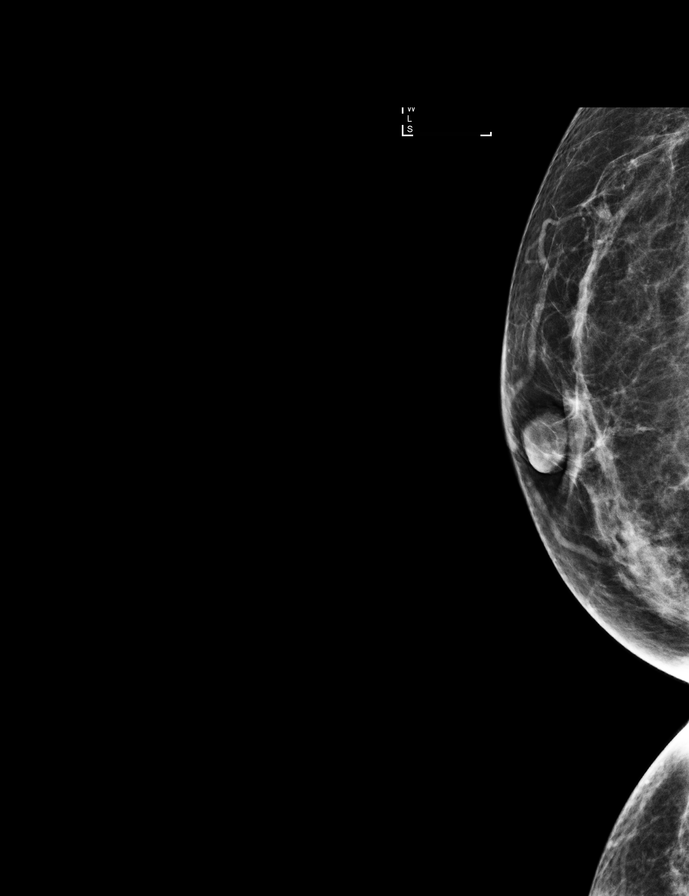

[8 of 36 positions shown; findings below may reference images not displayed]

ACR Breast Density Category b: There are scattered areas of
fibroglandular density.
FINDINGS: The patient has a retropectoral right implant. No suspicious mass or
malignant type microcalcifications identified in either breast.

On physical exam, I do not palpate a mass in the subareolar region
of the right breast. Clear yellow nipple discharge could be
expressed from a single duct.

Targeted ultrasound is performed, showing normal tissue in the
subareolar region of the right breast. No intraductal mass
identified.

Mammographic images were processed with CAD.
IMPRESSION: No evidence of malignancy in either breast.

RECOMMENDATION:
Surgical consultation and MRI of the right breast is recommended for
the spontaneous nipple discharge. Surgical consultation will be
arranged with [REDACTED]. The patient is due for

I have discussed the findings and recommendations with the patient.
Results were also provided in writing at the conclusion of the
visit. If applicable, a reminder letter will be sent to the patient
regarding the next appointment.

BI-RADS CATEGORY  1: Negative.

## 2018-07-03 ENCOUNTER — Other Ambulatory Visit: Payer: Self-pay | Admitting: Obstetrics and Gynecology

## 2018-07-03 DIAGNOSIS — Z1231 Encounter for screening mammogram for malignant neoplasm of breast: Secondary | ICD-10-CM

## 2018-08-20 ENCOUNTER — Ambulatory Visit
Admission: RE | Admit: 2018-08-20 | Discharge: 2018-08-20 | Disposition: A | Discharge status: Home / Self Care | Payer: 59 | Source: Ambulatory Visit | Attending: Obstetrics and Gynecology | Admitting: Obstetrics and Gynecology

## 2018-08-20 DIAGNOSIS — Z1231 Encounter for screening mammogram for malignant neoplasm of breast: Secondary | ICD-10-CM

## 2019-07-15 ENCOUNTER — Other Ambulatory Visit: Payer: Self-pay | Admitting: Obstetrics and Gynecology

## 2020-11-14 ENCOUNTER — Other Ambulatory Visit: Payer: Self-pay | Admitting: Obstetrics and Gynecology

## 2020-11-14 DIAGNOSIS — T8543XA Leakage of breast prosthesis and implant, initial encounter: Secondary | ICD-10-CM

## 2020-12-07 ENCOUNTER — Other Ambulatory Visit: Payer: Self-pay

## 2020-12-07 ENCOUNTER — Ambulatory Visit
Admission: RE | Admit: 2020-12-07 | Discharge: 2020-12-07 | Disposition: A | Discharge status: Home / Self Care | Payer: 59 | Source: Ambulatory Visit | Attending: Obstetrics and Gynecology | Admitting: Obstetrics and Gynecology

## 2020-12-07 DIAGNOSIS — T8543XA Leakage of breast prosthesis and implant, initial encounter: Secondary | ICD-10-CM

## 2021-04-24 ENCOUNTER — Other Ambulatory Visit: Payer: Self-pay | Admitting: Plastic Surgery

## 2021-04-24 DIAGNOSIS — N6452 Nipple discharge: Secondary | ICD-10-CM

## 2021-04-25 ENCOUNTER — Ambulatory Visit (HOSPITAL_BASED_OUTPATIENT_CLINIC_OR_DEPARTMENT_OTHER): Admit: 2021-04-25 | Payer: 59 | Admitting: Plastic Surgery

## 2021-04-25 ENCOUNTER — Encounter (HOSPITAL_BASED_OUTPATIENT_CLINIC_OR_DEPARTMENT_OTHER): Payer: Self-pay

## 2021-04-25 SURGERY — CAPSULECTOMY, BREAST, WITH REPLACEMENT OF IMPLANT
Anesthesia: General | Site: Chest | Laterality: Right

## 2021-04-29 ENCOUNTER — Ambulatory Visit: Payer: 59

## 2021-04-29 ENCOUNTER — Other Ambulatory Visit: Payer: Self-pay

## 2021-04-29 ENCOUNTER — Ambulatory Visit
Admission: RE | Admit: 2021-04-29 | Discharge: 2021-04-29 | Disposition: A | Discharge status: Home / Self Care | Payer: 59 | Source: Ambulatory Visit | Attending: Plastic Surgery | Admitting: Plastic Surgery

## 2021-04-29 DIAGNOSIS — N6452 Nipple discharge: Secondary | ICD-10-CM

## 2021-05-01 ENCOUNTER — Other Ambulatory Visit: Payer: Self-pay | Admitting: Plastic Surgery

## 2021-05-01 DIAGNOSIS — N6452 Nipple discharge: Secondary | ICD-10-CM

## 2021-05-02 ENCOUNTER — Other Ambulatory Visit: Payer: Self-pay

## 2021-05-02 ENCOUNTER — Ambulatory Visit
Admission: RE | Admit: 2021-05-02 | Discharge: 2021-05-02 | Disposition: A | Discharge status: Home / Self Care | Payer: 59 | Source: Ambulatory Visit | Attending: Plastic Surgery | Admitting: Plastic Surgery

## 2021-05-02 ENCOUNTER — Other Ambulatory Visit: Payer: Self-pay | Admitting: Plastic Surgery

## 2021-05-02 DIAGNOSIS — N6452 Nipple discharge: Secondary | ICD-10-CM

## 2022-02-28 ENCOUNTER — Other Ambulatory Visit: Payer: Self-pay | Admitting: Family Medicine

## 2022-02-28 DIAGNOSIS — Z1231 Encounter for screening mammogram for malignant neoplasm of breast: Secondary | ICD-10-CM

## 2022-04-30 ENCOUNTER — Ambulatory Visit
Admission: RE | Admit: 2022-04-30 | Discharge: 2022-04-30 | Disposition: A | Discharge status: Home / Self Care | Payer: Commercial Managed Care - PPO | Source: Ambulatory Visit | Attending: Family Medicine | Admitting: Family Medicine

## 2022-04-30 DIAGNOSIS — Z1231 Encounter for screening mammogram for malignant neoplasm of breast: Secondary | ICD-10-CM

## 2023-03-08 ENCOUNTER — Other Ambulatory Visit (HOSPITAL_COMMUNITY): Payer: Self-pay | Admitting: Family Medicine

## 2023-03-08 DIAGNOSIS — E7800 Pure hypercholesterolemia, unspecified: Secondary | ICD-10-CM

## 2023-03-27 ENCOUNTER — Other Ambulatory Visit: Payer: Self-pay | Admitting: Obstetrics and Gynecology

## 2023-03-27 DIAGNOSIS — Z1231 Encounter for screening mammogram for malignant neoplasm of breast: Secondary | ICD-10-CM

## 2023-04-16 ENCOUNTER — Ambulatory Visit (HOSPITAL_BASED_OUTPATIENT_CLINIC_OR_DEPARTMENT_OTHER)
Admission: RE | Admit: 2023-04-16 | Discharge: 2023-04-16 | Disposition: A | Discharge status: Home / Self Care | Payer: Commercial Managed Care - PPO | Source: Ambulatory Visit | Attending: Family Medicine | Admitting: Family Medicine

## 2023-04-16 DIAGNOSIS — E7800 Pure hypercholesterolemia, unspecified: Secondary | ICD-10-CM | POA: Insufficient documentation

## 2023-05-06 ENCOUNTER — Ambulatory Visit: Admission: RE | Admit: 2023-05-06 | Payer: Commercial Managed Care - PPO | Source: Ambulatory Visit

## 2023-05-06 DIAGNOSIS — Z1231 Encounter for screening mammogram for malignant neoplasm of breast: Secondary | ICD-10-CM

## 2024-04-01 ENCOUNTER — Other Ambulatory Visit: Payer: Self-pay | Admitting: Obstetrics and Gynecology

## 2024-04-01 DIAGNOSIS — Z1231 Encounter for screening mammogram for malignant neoplasm of breast: Secondary | ICD-10-CM

## 2024-05-08 ENCOUNTER — Ambulatory Visit
Admission: RE | Admit: 2024-05-08 | Discharge: 2024-05-08 | Disposition: A | Discharge status: Home / Self Care | Source: Ambulatory Visit | Attending: Obstetrics and Gynecology | Admitting: Obstetrics and Gynecology

## 2024-05-08 DIAGNOSIS — Z1231 Encounter for screening mammogram for malignant neoplasm of breast: Secondary | ICD-10-CM
